# Patient Record
Sex: Female | Born: 1985 | Race: White | Hispanic: No | Marital: Married | Smoking: Never smoker
Health system: Southern US, Community
[De-identification: ages and names within clinical notes are randomized; demographics above are authoritative.]

---
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
## 2005-02-04 ENCOUNTER — Ambulatory Visit: Payer: Self-pay | Admitting: Family Medicine

## 2005-06-18 ENCOUNTER — Ambulatory Visit: Payer: Self-pay | Admitting: Internal Medicine

## 2005-06-18 ENCOUNTER — Inpatient Hospital Stay (HOSPITAL_COMMUNITY): Admission: AD | Admit: 2005-06-18 | Discharge: 2005-06-18 | Payer: Self-pay | Admitting: Obstetrics and Gynecology

## 2005-08-04 ENCOUNTER — Other Ambulatory Visit: Admission: RE | Admit: 2005-08-04 | Discharge: 2005-08-04 | Payer: Self-pay | Admitting: Obstetrics and Gynecology

## 2005-09-30 ENCOUNTER — Ambulatory Visit: Payer: Self-pay | Admitting: Family Medicine

## 2008-07-23 ENCOUNTER — Inpatient Hospital Stay (HOSPITAL_COMMUNITY): Admission: AD | Admit: 2008-07-23 | Discharge: 2008-07-24 | Payer: Self-pay | Admitting: Obstetrics and Gynecology

## 2008-07-25 ENCOUNTER — Inpatient Hospital Stay (HOSPITAL_COMMUNITY): Admission: AD | Admit: 2008-07-25 | Discharge: 2008-07-25 | Payer: Self-pay | Admitting: Obstetrics and Gynecology

## 2008-07-26 ENCOUNTER — Inpatient Hospital Stay (HOSPITAL_COMMUNITY): Admission: AD | Admit: 2008-07-26 | Discharge: 2008-07-27 | Payer: Self-pay | Admitting: Obstetrics and Gynecology

## 2009-09-13 ENCOUNTER — Emergency Department (HOSPITAL_BASED_OUTPATIENT_CLINIC_OR_DEPARTMENT_OTHER): Admission: EM | Admit: 2009-09-13 | Discharge: 2009-09-14 | Payer: Self-pay | Admitting: Emergency Medicine

## 2009-09-14 ENCOUNTER — Ambulatory Visit: Payer: Self-pay | Admitting: Diagnostic Radiology

## 2011-01-12 LAB — CBC
Hemoglobin: 13.9 g/dL (ref 12.0–15.0)
MCHC: 34.9 g/dL (ref 30.0–36.0)
Platelets: 153 10*3/uL (ref 150–400)
RDW: 11.5 % (ref 11.5–15.5)

## 2011-01-12 LAB — DIFFERENTIAL
Basophils Relative: 1 % (ref 0–1)
Eosinophils Absolute: 0 10*3/uL (ref 0.0–0.7)
Eosinophils Relative: 1 % (ref 0–5)
Lymphs Abs: 0.5 10*3/uL — ABNORMAL LOW (ref 0.7–4.0)
Monocytes Absolute: 0.1 10*3/uL (ref 0.1–1.0)
Monocytes Relative: 2 % — ABNORMAL LOW (ref 3–12)
Neutrophils Relative %: 89 % — ABNORMAL HIGH (ref 43–77)

## 2011-01-12 LAB — COMPREHENSIVE METABOLIC PANEL
ALT: 23 U/L (ref 0–35)
AST: 20 U/L (ref 0–37)
Albumin: 4.2 g/dL (ref 3.5–5.2)
Alkaline Phosphatase: 27 U/L — ABNORMAL LOW (ref 39–117)
Chloride: 103 mEq/L (ref 96–112)
Potassium: 3.2 mEq/L — ABNORMAL LOW (ref 3.5–5.1)
Sodium: 143 mEq/L (ref 135–145)
Total Bilirubin: 0.9 mg/dL (ref 0.3–1.2)
Total Protein: 7.1 g/dL (ref 6.0–8.3)

## 2011-02-26 NOTE — Op Note (Signed)
NAME:  Michelle Alexander, Michelle Alexander                          ACCOUNT NO.:  [id number]   MEDICAL RECORD NO.:  [id number]                   PATIENT TYPE:  INP   LOCATION:  9148                                 FACILITY:  [facility]   PHYSICIAN:  Duke Salvia. Marcelle Overlie, M.D.            DATE OF BIRTH:  March 22, 1986   DATE OF PROCEDURE:  02/25/2004  DATE OF DISCHARGE:                                 OPERATIVE REPORT   The patient had epidural anesthesia and had been pushing for about an hour  when it was noted that she was having initially mild tachycardia without  decels.  A scalp lead was applied.  As she continued to push, she developed  more significant fetal tachycardia in the 190 to 200 range with good  variability by IFE without significant decelerations.  At that point, she  was noted to be straight OA, starting to crown.  Because of the persistent  tachycardia, the decision was made to proceed with BE assisted delivery  which was explained to the patient.  Her Foley catheter was removed.  She  had adequate epidural anesthesia.  The perineum was infiltrated with  Xylocaine local.  Kiwi VE was applied, traction efforts coordinated with  maternal pushing x 1 to affect delivery of a female, Apgars 8 and 9.  McRoberts positioning and suprapubic pressure by the RN were applied along  with moderate downward traction to affect delivery of a mild to moderate  shoulder dystocia without difficulty.  The posterior shoulder delivered  easily.  The infant was stimulated and was stable and was later taken to the  newborn nursery in good condition, primarily to evaluate the persistent  tachycardia.  The patient had received two doses of antibiotics for GBS  prophylaxis during the course of her labor.  The placenta was then delivered  spontaneously and sent to pathology.  Exploration revealed midline  episiotomy without extension.  No other lacerations were noted.  Cord blood  was sent for blood studies and also for stem cell  banking.  The episiotomy  was repaired in standard layered fashion with 2-0 Vicryl sutures.  The  rectum and sphincter muscles were both inspected and intact.  Mother and  baby doing well at that point.                                               Richard M. Marcelle Overlie, M.D.    RMH/MEDQ  D:  02/25/2004  T:  02/25/2004  Job:  [id number]

## 2011-03-19 ENCOUNTER — Inpatient Hospital Stay (HOSPITAL_COMMUNITY)
Admission: RE | Admit: 2011-03-19 | Discharge: 2011-03-20 | DRG: 373 | Disposition: A | Discharge status: Home / Self Care | Payer: BC Managed Care – PPO | Source: Ambulatory Visit | Attending: Obstetrics and Gynecology | Admitting: Obstetrics and Gynecology

## 2011-03-19 LAB — CBC
HCT: 37.4 % (ref 36.0–46.0)
MCH: 34.9 pg — ABNORMAL HIGH (ref 26.0–34.0)
MCV: 101.1 fL — ABNORMAL HIGH (ref 78.0–100.0)
RDW: 13.9 % (ref 11.5–15.5)
WBC: 7 10*3/uL (ref 4.0–10.5)

## 2011-03-20 ENCOUNTER — Inpatient Hospital Stay (HOSPITAL_COMMUNITY): Admit: 2011-03-20 | Payer: BC Managed Care – PPO | Admitting: Obstetrics and Gynecology

## 2011-03-20 LAB — CBC
MCH: 33.9 pg (ref 26.0–34.0)
MCHC: 33.5 g/dL (ref 30.0–36.0)
MCV: 101.3 fL — ABNORMAL HIGH (ref 78.0–100.0)
Platelets: 161 10*3/uL (ref 150–400)
RBC: 3.89 MIL/uL (ref 3.87–5.11)

## 2011-04-09 NOTE — Op Note (Signed)
  Michelle Alexander, NYCE NO.:  [id number]  MEDICAL RECORD NO.:  [id number]  LOCATION:  9125                          FACILITY:  [facility]  PHYSICIAN:  Guy Sandifer. Henderson Cloud, M.D. DATE OF BIRTH:  13-Oct-1985  DATE OF PROCEDURE: DATE OF DISCHARGE:                              OPERATIVE REPORT   PROCEDURE:  Vaginal delivery.  PROCEDURE:  This patient is a 25 year old married white female G3, P2 at 39-6/7 weeks whose prenatal care was uncomplicated.  She was admitted for induction with a favorable cervix.  Group B strep culture is negative.  After artificial rupture of membranes for clear fluid, she had rapid progress.  Epidural was placed.  After placing epidural, she was complete and pushing.  She makes good progress in the second stage. Spontaneous delivery of the vertex is noted with the baby in the ROT, ROP position.  At that point, the shoulders are essentially transverse with the back down.  The baby's right elbow was flexed and the hand is in front of the shoulder.  This allows easy rotation of the shoulders in a counterclockwise direction which enables delivery of the right hand and arm without excessive traction.  The baby then delivers without difficulty.  Viable female infant, Apgars of 7 and 9 at 1 and 5 minutes respectively are noted.  Arterial cord pH 7.25.  Birth weight is pending.  Placenta's three vessels intact.  Estimated blood loss 400 mL. Cervix, vagina, perineum are intact.  The patient and infant are stable in labor and delivery room.     Guy Sandifer Henderson Cloud, M.D.     JET/MEDQ  D:  03/19/2011  T:  03/20/2011  Job:  [id number]  Electronically Signed by Harold Hedge M.D. on 04/09/2011 09:14:38 AM

## 2011-07-12 LAB — CBC
HCT: 39.5
MCHC: 33.8
MCV: 99.4
Platelets: 167
RDW: 12.9
RDW: 13

## 2011-07-12 LAB — RPR: RPR Ser Ql: NONREACTIVE

## 2011-07-12 LAB — CCBB MATERNAL DONOR DRAW

## 2013-02-27 ENCOUNTER — Other Ambulatory Visit: Payer: Self-pay | Admitting: Obstetrics and Gynecology

## 2016-10-18 DIAGNOSIS — M810 Age-related osteoporosis without current pathological fracture: Secondary | ICD-10-CM | POA: Diagnosis not present

## 2016-10-18 DIAGNOSIS — R5383 Other fatigue: Secondary | ICD-10-CM | POA: Diagnosis not present

## 2016-10-18 DIAGNOSIS — E559 Vitamin D deficiency, unspecified: Secondary | ICD-10-CM | POA: Diagnosis not present

## 2016-10-22 DIAGNOSIS — Z23 Encounter for immunization: Secondary | ICD-10-CM | POA: Diagnosis not present

## 2017-07-06 DIAGNOSIS — Z23 Encounter for immunization: Secondary | ICD-10-CM | POA: Diagnosis not present

## 2017-11-10 DIAGNOSIS — L811 Chloasma: Secondary | ICD-10-CM | POA: Diagnosis not present

## 2017-11-10 DIAGNOSIS — B078 Other viral warts: Secondary | ICD-10-CM | POA: Diagnosis not present

## 2017-11-10 DIAGNOSIS — L700 Acne vulgaris: Secondary | ICD-10-CM | POA: Diagnosis not present

## 2017-11-23 ENCOUNTER — Encounter (INDEPENDENT_AMBULATORY_CARE_PROVIDER_SITE_OTHER): Payer: Self-pay | Admitting: Physician Assistant

## 2017-11-23 ENCOUNTER — Ambulatory Visit (INDEPENDENT_AMBULATORY_CARE_PROVIDER_SITE_OTHER): Payer: 59

## 2017-11-23 ENCOUNTER — Ambulatory Visit (INDEPENDENT_AMBULATORY_CARE_PROVIDER_SITE_OTHER): Payer: 59 | Admitting: Physician Assistant

## 2017-11-23 VITALS — Ht 66.0 in | Wt 135.0 lb

## 2017-11-23 DIAGNOSIS — M2242 Chondromalacia patellae, left knee: Secondary | ICD-10-CM

## 2017-11-23 DIAGNOSIS — M25562 Pain in left knee: Secondary | ICD-10-CM

## 2017-11-23 MED ORDER — LIDOCAINE HCL 1 % IJ SOLN
3.0000 mL | INTRAMUSCULAR | Status: AC | PRN
  Administered 2017-11-23: 3 mL

## 2017-11-23 MED ORDER — METHYLPREDNISOLONE ACETATE 40 MG/ML IJ SUSP
40.0000 mg | INTRAMUSCULAR | Status: AC | PRN
  Administered 2017-11-23: 40 mg via INTRA_ARTICULAR

## 2017-11-23 NOTE — Progress Notes (Signed)
Office Visit Note   Patient: Michelle Alexander           Date of Birth: 03-29-1986           MRN: [id number] Visit Date: 11/23/2017              Requested by: No referring provider defined for this encounter. PCP: Michelle Alexander   Assessment & Plan: Visit Diagnoses:  1. Acute pain of left knee   2. Chondromalacia patellae, left knee     Plan: Discussed with her knee friendly exercises.  Also quad strengthening exercises reviewed with patient.  She will follow-up with Michelle Alexander on as-needed basis pain persist.  Discussed with her over the counter NSAIDs.  Open patella brace which she can use while doing activities until pain resolves.  Follow-Up Instructions: Return if symptoms worsen or fail to improve.   Orders:  Orders Placed This Encounter  Procedures  . Large Joint Inj: L knee  . XR KNEE 3 VIEW LEFT   No orders of the defined types were placed in this encounter.     Procedures: Large Joint Inj: L knee on 11/23/2017 3:20 PM Indications: pain Details: 22 G 1.5 in needle, anterolateral approach  Arthrogram: No  Medications: 3 mL lidocaine 1 %; 40 mg methylPREDNISolone acetate 40 MG/ML Outcome: tolerated well, no immediate complications Procedure, treatment alternatives, risks and benefits explained, specific risks discussed. Consent was given by the patient. Immediately prior to procedure a time out was called to verify the correct patient, procedure, equipment, support staff and site/side marked as required. Patient was prepped and draped in the usual sterile fashion.       Clinical Data: No additional findings.   Subjective: Chief Complaint  Patient presents with  . Left Knee - Pain    HPI Michelle Alexander is a 32 year old female comes in today due to left knee pain.  She states that she is having problem is been the knee for years but is gotten worse recently.  She states the left knee is painful with some stiffness loosens up with walking.  She has pain with prolonged  sitting.  Sensitive to touch lateral aspect of the kneecap.  Also seems like the knee will not straighten out all the way.  She taken ibuprofen which helps some.  She does play tennis and does work out a lot.  She reports 10 years ago she received a shot by Michelle Alexander went to physical therapy which helps some with her knee pain.  She does feel like the knee gives way at times.  Review of Systems  Please see HPI otherwise negative  Objective: Vital Signs: Ht 5\' 6"  (1.676 m)   Wt 135 lb (61.2 kg)   BMI 21.79 kg/m   Physical Exam  Constitutional: She is oriented to person, place, and time. She appears well-developed and well-nourished. No distress.  Pulmonary/Chest: Effort normal.  Neurological: She is alert and oriented to person, place, and time.  Skin: She is not diaphoretic.  Psychiatric: She has a normal mood and affect.    Ortho Exam Bilateral knees no effusion abnormal warmth erythema.  She may lack a couple degrees of hyperextension of the left knee compared to the right but otherwise good range of motion of both knees.  No instability valgus varus stressing McMurray's negative bilaterally.  She has no tenderness along medial lateral joint line of either knee.  Left knee with tenderness peripatellar region.  Manipulation of the patella reports that the  patellofemoral notch causes discomfort.  She has a positive Michelle Alexander on the left negative on the right.  No tenderness over the patella tibial tendon she will do straight leg raise bilaterally. Specialty Comments:  No specialty comments available.  Imaging: Xr Knee 3 View Left  Result Date: 11/23/2017 Left knee AP lateral and sunrise views: No acute findings.  All 3 compartments well maintained.  No subluxation dislocation of the knee.    PMFS History: There are no active problems to display for this patient.  History reviewed. No pertinent past medical history.  History reviewed. No pertinent family history.  History  reviewed. No pertinent surgical history. Social History   Occupational History  . Not on file  Tobacco Use  . Smoking status: Never Smoker  . Smokeless tobacco: Never Used  Substance and Sexual Activity  . Alcohol use: Yes  . Drug use: No  . Sexual activity: Not on file

## 2018-07-13 DIAGNOSIS — L700 Acne vulgaris: Secondary | ICD-10-CM | POA: Diagnosis not present

## 2018-07-13 DIAGNOSIS — B078 Other viral warts: Secondary | ICD-10-CM | POA: Diagnosis not present

## 2018-07-17 DIAGNOSIS — Z23 Encounter for immunization: Secondary | ICD-10-CM | POA: Diagnosis not present

## 2018-07-17 DIAGNOSIS — Z6823 Body mass index (BMI) 23.0-23.9, adult: Secondary | ICD-10-CM | POA: Diagnosis not present

## 2018-07-17 DIAGNOSIS — H938X9 Other specified disorders of ear, unspecified ear: Secondary | ICD-10-CM | POA: Diagnosis not present

## 2018-12-01 DIAGNOSIS — R5383 Other fatigue: Secondary | ICD-10-CM | POA: Diagnosis not present

## 2018-12-01 DIAGNOSIS — R5381 Other malaise: Secondary | ICD-10-CM | POA: Diagnosis not present

## 2018-12-01 DIAGNOSIS — R05 Cough: Secondary | ICD-10-CM | POA: Diagnosis not present

## 2019-12-27 ENCOUNTER — Ambulatory Visit: Payer: 59 | Attending: Internal Medicine

## 2019-12-27 DIAGNOSIS — Z23 Encounter for immunization: Secondary | ICD-10-CM

## 2019-12-27 NOTE — Progress Notes (Signed)
   Covid-19 Vaccination Clinic  Name:  LANICE FOLDEN    MRN: [id number] DOB: [birth year]  12/27/2019  Ms. Bale was observed post Covid-19 immunization for 15 minutes without incident. She was provided with Vaccine Information Sheet and instruction to access the V-Safe system.   Ms. Gilmer was instructed to call 911 with any severe reactions post vaccine: Marland Kitchen Difficulty breathing  . Swelling of face and throat  . A fast heartbeat  . A bad rash all over body  . Dizziness and weakness   Immunizations Administered    Name Date Dose VIS Date Route   Pfizer COVID-19 Vaccine 12/27/2019 10:04 AM 0.3 mL 09/21/2019 Intramuscular   Manufacturer: [employer], [employer]   Lot: [id number]   NDC: [address]-2

## 2020-01-09 ENCOUNTER — Other Ambulatory Visit: Payer: 59

## 2020-01-09 ENCOUNTER — Ambulatory Visit: Payer: 59 | Attending: Internal Medicine

## 2020-01-09 DIAGNOSIS — Z20822 Contact with and (suspected) exposure to covid-19: Secondary | ICD-10-CM

## 2020-01-10 LAB — SARS-COV-2, NAA 2 DAY TAT

## 2020-01-10 LAB — NOVEL CORONAVIRUS, NAA: SARS-CoV-2, NAA: NOT DETECTED

## 2020-01-21 ENCOUNTER — Ambulatory Visit: Payer: 59 | Attending: Internal Medicine

## 2020-01-21 DIAGNOSIS — Z23 Encounter for immunization: Secondary | ICD-10-CM

## 2020-01-21 NOTE — Progress Notes (Signed)
   Covid-19 Vaccination Clinic  Name:  Michelle Alexander    MRN: [id number] DOB: March 28, 1986  01/21/2020  [name] was observed post Covid-19 immunization for 15 minutes without incident. She was provided with Vaccine Information Sheet and instruction to access the V-Safe system.   [name] was instructed to call 911 with any severe reactions post vaccine: Marland Kitchen Difficulty breathing  . Swelling of face and throat  . A fast heartbeat  . A bad rash all over body  . Dizziness and weakness   Immunizations Administered    Name Date Dose VIS Date Route   Pfizer COVID-19 Vaccine 01/21/2020 12:07 PM 0.3 mL 09/21/2019 Intramuscular   Manufacturer: [employer], [employer]   Lot: [id number]   NDC: [address]-8

## 2020-03-03 ENCOUNTER — Ambulatory Visit: Payer: 59 | Attending: Internal Medicine

## 2020-03-03 DIAGNOSIS — Z20822 Contact with and (suspected) exposure to covid-19: Secondary | ICD-10-CM

## 2020-03-04 LAB — SARS-COV-2, NAA 2 DAY TAT

## 2020-03-04 LAB — NOVEL CORONAVIRUS, NAA: SARS-CoV-2, NAA: NOT DETECTED

## 2020-03-24 ENCOUNTER — Ambulatory Visit: Payer: 59

## 2020-03-31 ENCOUNTER — Ambulatory Visit: Payer: 59 | Attending: Internal Medicine

## 2020-03-31 ENCOUNTER — Other Ambulatory Visit: Payer: 59

## 2020-03-31 DIAGNOSIS — Z20822 Contact with and (suspected) exposure to covid-19: Secondary | ICD-10-CM

## 2020-04-01 LAB — NOVEL CORONAVIRUS, NAA: SARS-CoV-2, NAA: NOT DETECTED

## 2020-04-01 LAB — SARS-COV-2, NAA 2 DAY TAT

## 2020-05-14 ENCOUNTER — Encounter: Payer: Self-pay | Admitting: Orthopaedic Surgery

## 2020-05-14 ENCOUNTER — Ambulatory Visit: Payer: 59 | Admitting: Orthopaedic Surgery

## 2020-05-14 ENCOUNTER — Ambulatory Visit: Payer: Self-pay

## 2020-05-14 VITALS — Ht 67.0 in | Wt 137.6 lb

## 2020-05-14 DIAGNOSIS — M25521 Pain in right elbow: Secondary | ICD-10-CM

## 2020-05-14 MED ORDER — PENNSAID 2 % EX SOLN: 2.0000 g | Freq: Two times a day (BID) | CUTANEOUS | 3 refills | Status: DC | PRN

## 2020-05-14 NOTE — Progress Notes (Signed)
   Office Visit Note   Patient: Michelle Alexander           Date of Birth: [birth year]           MRN: [id number] Visit Date: 05/14/2020              Requested by: Martha Clan, MD [address] [place],  [place] [address] PCP: Martha Clan, MD   Assessment & Plan: Visit Diagnoses:  1. Pain in right elbow     Plan: Impression is right elbow lateral epicondylitis.  We had a detailed discussion today on treatment options and on things that she can modify in terms of her tennis gear.  She has ordered a counterforce brace.  I sent a prescription for pennsaid.  Referral to outpatient PT with modalities should she want to do this.  I gave her home exercises with resistance bands as well.  We discussed proper warm up and relative activity modifications.  Continue take Advil as needed.  Do not feel that she is in need of a cortisone injection today.  Questions encouraged and answered.  Follow-Up Instructions: Return if symptoms worsen or fail to improve.   Orders:  Orders Placed This Encounter  Procedures  . XR Elbow Complete Right (3+View)  . Ambulatory referral to Physical Therapy   Meds ordered this encounter  Medications  . Diclofenac Sodium (PENNSAID) 2 % SOLN    Sig: Apply 2 g topically 2 (two) times daily as needed (to affected area).    Dispense:  112 g    Refill:  3      Procedures: No procedures performed   Clinical Data: No additional findings.   Subjective: Chief Complaint  Patient presents with  . Right Elbow - Pain    [name] is a 34 year old female who is one of my wife's friends who comes in for lateral elbow pain for about 6 weeks with progressive worsening.  She did recently make a change in her tendon strings to polyester.  She states that she has pain when she is playing tennis as well as when she is opening the refrigerator door and picking up stuff.  Advil does help.  Denies any numbness and tingling.  Denies any injuries.   Review of  Systems   Objective: Vital Signs: Ht 5\' 7"  (1.702 m)   Wt 137 lb 9.6 oz (62.4 kg)   BMI 21.55 kg/m   Physical Exam  Ortho Exam Right elbow shows full range of motion without swelling or temperature changes.  She has tenderness of the lateral epicondyle near the common extensor attachment.  Radial tunnel is nontender.  She has reproducible pain with resisted wrist extension and ECRB extension.  Medial side of the elbow is unremarkable. Specialty Comments:  No specialty comments available.  Imaging: XR Elbow Complete Right (3+View)  Result Date: 05/14/2020 No acute or structural abnormalities.    PMFS History: There are no problems to display for this patient.  History reviewed. No pertinent past medical history.  History reviewed. No pertinent family history.  History reviewed. No pertinent surgical history. Social History   Occupational History  . Not on file  Tobacco Use  . Smoking status: Never Smoker  . Smokeless tobacco: Never Used  Substance and Sexual Activity  . Alcohol use: Yes  . Drug use: No  . Sexual activity: Not on file

## 2020-06-10 ENCOUNTER — Encounter: Payer: Self-pay | Admitting: Orthopaedic Surgery

## 2020-06-10 ENCOUNTER — Ambulatory Visit (INDEPENDENT_AMBULATORY_CARE_PROVIDER_SITE_OTHER): Payer: 59 | Admitting: Orthopaedic Surgery

## 2020-06-10 VITALS — Ht 67.0 in | Wt 137.0 lb

## 2020-06-10 DIAGNOSIS — M7711 Lateral epicondylitis, right elbow: Secondary | ICD-10-CM | POA: Diagnosis not present

## 2020-06-10 MED ORDER — LIDOCAINE HCL 1 % IJ SOLN
1.0000 mL | INTRAMUSCULAR | Status: AC | PRN
  Administered 2020-06-10: 1 mL

## 2020-06-10 MED ORDER — BUPIVACAINE HCL 0.5 % IJ SOLN
1.0000 mL | INTRAMUSCULAR | Status: AC | PRN
  Administered 2020-06-10: 1 mL via INTRA_ARTICULAR

## 2020-06-10 MED ORDER — METHYLPREDNISOLONE ACETATE 40 MG/ML IJ SUSP
40.0000 mg | INTRAMUSCULAR | Status: AC | PRN
  Administered 2020-06-10: 40 mg via INTRA_ARTICULAR

## 2020-06-10 NOTE — Progress Notes (Signed)
   Office Visit Note   Patient: Michelle Alexander           Date of Birth: [birth year]           MRN: [id number] Visit Date: 06/10/2020              Requested by: Martha Clan, MD [address] [place],  [place] [address] PCP: Martha Clan, MD   Assessment & Plan: Visit Diagnoses:  1. Lateral epicondylitis, right elbow     Plan: Impression is worsening right lateral epicondylitis with ADLs.  At this point I feel is appropriate to perform a cortisone injection.  She will continue to rest from tennis.  She has made a lot of modifications to her equipment.  She tolerated the injection well today.  Follow-Up Instructions: Return if symptoms worsen or fail to improve.   Orders:  No orders of the defined types were placed in this encounter.  No orders of the defined types were placed in this encounter.     Procedures: Medium Joint Inj: R lateral epicondyle on 06/10/2020 11:29 AM Indications: pain Details: 25 G needle Medications: 1 mL lidocaine 1 %; 1 mL bupivacaine 0.5 %; 40 mg methylPREDNISolone acetate 40 MG/ML Outcome: tolerated well, no immediate complications Patient was prepped and draped in the usual sterile fashion.       Clinical Data: No additional findings.   Subjective: Chief Complaint  Patient presents with  . Right Elbow - Follow-up    Michelle Alexander returns today for continued right elbow pain from lateral epicondylitis.  She now has pain in her elbow even with almost all ADLs.   Review of Systems   Objective: Vital Signs: Ht 5\' 7"  (1.702 m)   Wt 137 lb (62.1 kg)   BMI 21.46 kg/m   Physical Exam  Ortho Exam Exam is unchanged.  Continued point tenderness to the common extensor tendon insertion with pain with resisted long finger extension.  No numbness or tingling radiating down into the forearm or fingers. Specialty Comments:  No specialty comments available.  Imaging: No results found.   PMFS History: There are no problems to display for this  patient.  History reviewed. No pertinent past medical history.  History reviewed. No pertinent family history.  History reviewed. No pertinent surgical history. Social History   Occupational History  . Not on file  Tobacco Use  . Smoking status: Never Smoker  . Smokeless tobacco: Never Used  Substance and Sexual Activity  . Alcohol use: Yes  . Drug use: No  . Sexual activity: Not on file

## 2020-06-12 ENCOUNTER — Ambulatory Visit: Payer: 59 | Admitting: Physician Assistant

## 2020-06-24 ENCOUNTER — Ambulatory Visit: Payer: 59 | Admitting: Orthopaedic Surgery

## 2020-07-14 ENCOUNTER — Other Ambulatory Visit: Payer: 59

## 2020-07-14 DIAGNOSIS — Z20822 Contact with and (suspected) exposure to covid-19: Secondary | ICD-10-CM

## 2020-07-16 LAB — NOVEL CORONAVIRUS, NAA: SARS-CoV-2, NAA: NOT DETECTED

## 2020-07-16 LAB — SARS-COV-2, NAA 2 DAY TAT

## 2020-10-11 ENCOUNTER — Telehealth: Payer: Self-pay | Admitting: Orthopaedic Surgery

## 2020-10-11 NOTE — Telephone Encounter (Signed)
I received a message from [place] stating that her right elbow continues to hurt despite having had the steroid injection in august.  She had temporary relief from the injection.  Has continued pain with ADLs and with playing tennis.  At this point, we will need to order an MRI of the right elbow to assess for structural abnormalities.

## 2020-10-13 ENCOUNTER — Telehealth: Payer: Self-pay

## 2020-10-13 DIAGNOSIS — M25521 Pain in right elbow: Secondary | ICD-10-CM

## 2020-10-13 NOTE — Telephone Encounter (Signed)
Per Dr.Xu need to order MRI of right elbow    MRI ordered

## 2020-10-17 ENCOUNTER — Ambulatory Visit
Admission: RE | Admit: 2020-10-17 | Discharge: 2020-10-17 | Disposition: A | Discharge status: Home / Self Care | Payer: 59 | Source: Ambulatory Visit | Attending: Orthopaedic Surgery | Admitting: Orthopaedic Surgery

## 2020-10-17 ENCOUNTER — Other Ambulatory Visit: Payer: Self-pay

## 2020-10-17 DIAGNOSIS — M25521 Pain in right elbow: Secondary | ICD-10-CM

## 2020-10-20 NOTE — Progress Notes (Signed)
I spoke to Michelle Alexander yesterday about MRI results.  Please call her to schedule surgery.  SCG, supraclavicular or axillary block, biomet anchors available.  CPT tennis elbow release with repair.  Thanks.

## 2020-10-29 ENCOUNTER — Other Ambulatory Visit: Payer: Self-pay | Admitting: Physician Assistant

## 2020-10-29 MED ORDER — ONDANSETRON HCL 4 MG PO TABS: 4.0000 mg | ORAL_TABLET | Freq: Three times a day (TID) | ORAL | 0 refills | Status: DC | PRN

## 2020-10-29 MED ORDER — HYDROCODONE-ACETAMINOPHEN 5-325 MG PO TABS
1.0000 | ORAL_TABLET | Freq: Three times a day (TID) | ORAL | 0 refills | Status: DC | PRN
Start: 2020-10-29 — End: 2023-06-29

## 2020-10-30 ENCOUNTER — Other Ambulatory Visit: Payer: Self-pay | Admitting: Orthopaedic Surgery

## 2020-10-30 ENCOUNTER — Encounter: Payer: Self-pay | Admitting: Orthopaedic Surgery

## 2020-10-30 DIAGNOSIS — M7711 Lateral epicondylitis, right elbow: Secondary | ICD-10-CM | POA: Insufficient documentation

## 2020-10-30 MED ORDER — OXYCODONE HCL 5 MG PO TABS
5.0000 mg | ORAL_TABLET | ORAL | 0 refills | Status: DC | PRN
Start: 2020-10-30 — End: 2023-06-29

## 2020-11-06 ENCOUNTER — Other Ambulatory Visit: Payer: Self-pay

## 2020-11-06 ENCOUNTER — Ambulatory Visit (INDEPENDENT_AMBULATORY_CARE_PROVIDER_SITE_OTHER): Payer: 59 | Admitting: Orthopaedic Surgery

## 2020-11-06 ENCOUNTER — Encounter: Payer: Self-pay | Admitting: Orthopaedic Surgery

## 2020-11-06 DIAGNOSIS — M7711 Lateral epicondylitis, right elbow: Secondary | ICD-10-CM

## 2020-11-06 NOTE — Progress Notes (Signed)
   Post-Op Visit Note   Patient: Michelle Alexander           Date of Birth: 1986/09/07           MRN: [id number] Visit Date: 11/06/2020 PCP: Martha Clan, MD   Assessment & Plan:  Chief Complaint:  Chief Complaint  Patient presents with  . Right Elbow - Pain, Routine Post Op   Visit Diagnoses:  1. Lateral epicondylitis, right elbow     Plan:   [name] is 1 week status post right tennis elbow release.  Overall doing well with some mild pain.  No real complaints.  Numbness in the index finger has completely resolved.  Surgical incision is intact.  No signs of infection.  She has some mild stiffness of the elbow.  At this point we will apply Neosporin with a large Band-Aid on a daily basis.  She will continue to wear the wrist brace for very light activities.  She will work on elbow range of motion on her own.  I demonstrated exercises.  Recheck in 3 weeks.  Follow-Up Instructions: Return in about 3 weeks (around 11/27/2020).   Orders:  No orders of the defined types were placed in this encounter.  No orders of the defined types were placed in this encounter.   Imaging: No results found.  PMFS History: Patient Active Problem List   Diagnosis Date Noted  . Lateral epicondylitis, right elbow 10/30/2020   History reviewed. No pertinent past medical history.  History reviewed. No pertinent family history.  History reviewed. No pertinent surgical history. Social History   Occupational History  . Not on file  Tobacco Use  . Smoking status: Never Smoker  . Smokeless tobacco: Never Used  Substance and Sexual Activity  . Alcohol use: Yes  . Drug use: No  . Sexual activity: Not on file

## 2020-11-27 ENCOUNTER — Encounter: Payer: Self-pay | Admitting: Orthopaedic Surgery

## 2020-11-27 ENCOUNTER — Ambulatory Visit (INDEPENDENT_AMBULATORY_CARE_PROVIDER_SITE_OTHER): Payer: 59 | Admitting: Orthopaedic Surgery

## 2020-11-27 DIAGNOSIS — M7711 Lateral epicondylitis, right elbow: Secondary | ICD-10-CM

## 2020-11-27 NOTE — Progress Notes (Signed)
   Post-Op Visit Note   Patient: Michelle Alexander           Date of Birth: 04-11-1986           MRN: [id number] Visit Date: 11/27/2020 PCP: Martha Clan, MD   Assessment & Plan:  Chief Complaint:  Chief Complaint  Patient presents with  . Right Elbow - Pain   Visit Diagnoses:  1. Lateral epicondylitis, right elbow     Plan:   Edrie is 4 weeks status post right tennis elbow release.  Overall doing well.  She has weaned herself out of the wrist brace.  Surgical scar is fully healed.  She lacks about 5 degrees of full elbow extension and flexion.  There is no pain with passive stretch or with grasping.  No signs of infection.  At this point we will make a referral to OT to begin range of motion and strengthening and ADLs.  She is to limit lifting to 5 to 10 pounds or anything that causes pain.  Recheck in 8 weeks or sooner if any issues.  Follow-Up Instructions: Return in about 8 weeks (around 01/22/2021).   Orders:  Orders Placed This Encounter  Procedures  . Ambulatory referral to Occupational Therapy   No orders of the defined types were placed in this encounter.   Imaging: No results found.  PMFS History: Patient Active Problem List   Diagnosis Date Noted  . Lateral epicondylitis, right elbow 10/30/2020   History reviewed. No pertinent past medical history.  History reviewed. No pertinent family history.  History reviewed. No pertinent surgical history. Social History   Occupational History  . Not on file  Tobacco Use  . Smoking status: Never Smoker  . Smokeless tobacco: Never Used  Substance and Sexual Activity  . Alcohol use: Yes  . Drug use: No  . Sexual activity: Not on file

## 2020-12-05 ENCOUNTER — Other Ambulatory Visit: Payer: Self-pay

## 2020-12-05 ENCOUNTER — Ambulatory Visit: Payer: 59 | Attending: Orthopaedic Surgery | Admitting: Occupational Therapy

## 2020-12-05 DIAGNOSIS — M6281 Muscle weakness (generalized): Secondary | ICD-10-CM | POA: Diagnosis present

## 2020-12-05 DIAGNOSIS — M25621 Stiffness of right elbow, not elsewhere classified: Secondary | ICD-10-CM | POA: Diagnosis present

## 2020-12-05 DIAGNOSIS — M25521 Pain in right elbow: Secondary | ICD-10-CM | POA: Insufficient documentation

## 2020-12-05 DIAGNOSIS — M7711 Lateral epicondylitis, right elbow: Secondary | ICD-10-CM | POA: Diagnosis present

## 2020-12-05 DIAGNOSIS — M25631 Stiffness of right wrist, not elsewhere classified: Secondary | ICD-10-CM | POA: Insufficient documentation

## 2020-12-05 NOTE — Therapy (Signed)
[facility] [address] [place], [place], [address] Phone: [phone]   Fax:  [phone]  Occupational Therapy Evaluation  Patient Details  Name: Michelle Alexander MRN: [id number] Date of Birth: 1986/06/14 Referring Provider (OT): Gershon Mussel (MD)   Encounter Date: 12/05/2020   OT End of Session - 12/05/20 1612    Visit Number 1    Number of Visits 9    Date for OT Re-Evaluation 01/02/21    Authorization Type [employer] $30 copay    Authorization Time Period VL:23 OT    Authorization - Number of Visits 23    OT Start Time 1500    OT Stop Time 1533    OT Time Calculation (min) 33 min    Activity Tolerance Patient tolerated treatment well    Behavior During Therapy [facility] for tasks assessed/performed           No past medical history on file.  No past surgical history on file.  There were no vitals filed for this visit.   Subjective Assessment - 12/05/20 1503    Subjective  Pt reports right elbow just feels tight and weak.    Pertinent History PMH    Currently in Pain? Yes    Pain Score 3     Pain Location Elbow    Pain Orientation Right    Pain Descriptors / Indicators Tightness    Pain Type Acute pain    Pain Onset 1 to 4 weeks ago    Pain Frequency Intermittent             [facility] OT Assessment - 12/05/20 1504      Assessment   Medical Diagnosis Lateral Epicondylitis, right    Referring Provider (OT) Gershon Mussel (MD)    Onset Date/Surgical Date 10/30/20    Hand Dominance Right      Precautions   Precautions None    Precaution Comments progressive strengthening, ROM and ADLs    Required Braces or Orthoses Other Brace/Splint    Other Brace/Splint right wrist brace      Restrictions   Weight Bearing Restrictions No      Balance Screen   Has the patient fallen in the past 6 months No      Home  Environment   Family/patient expects to be discharged to: Private residence    Living Arrangements  Spouse/significant other   and children (3)   Type of Home House    Home Access Stairs    Bathroom Shower/Tub Walk-in Shower    Additional Comments pt not at risk for falls with stairs and home layout      Prior Function   Level of Independence Independent    Vocation Unemployed    Leisure stuff with kids, playing tennis      ADL   ADL comments Pt independent with all ADLs and does not report any difficulty at this time      Written Expression   Dominant Hand Right    Handwriting 100% legible   fatigues with handwriting     Cognition   Overall Cognitive Status Within Functional Limits for tasks assessed      Posture/Postural Control   Posture/Postural Control No significant limitations      Sensation   Light Touch Appears Intact    Hot/Cold Appears Intact      Coordination   9 Hole Peg Test Right;Left    Right 9 Hole Peg Test 16.03    Left 9 [place]  Peg Test 16.28      ROM / Strength   AROM / PROM / Strength AROM;Strength      AROM   Overall AROM  Within functional limits for tasks performed    Overall AROM Comments some tightness at elbow per pt report but ROM [facility]      Strength   Overall Strength Deficits    Overall Strength Comments RUE deficits    Strength Assessment Site Elbow;Wrist    Right/Left Elbow Right    Right Elbow Flexion 4-/5    Right Elbow Extension 4-/5    Right/Left Wrist Right    Right Wrist Flexion 3+/5    Right Wrist Extension 3+/5      Hand Function   Right Hand Gross Grasp Functional    Right Hand Grip (lbs) 54.8    Left Hand Gross Grasp Functional    Left Hand Grip (lbs) 68.3                           OT Education - 12/05/20 1547    Education Details Education on role and purpose of OT  HEP for elbow ROM    Person(s) Educated Patient    Methods Explanation;Demonstration;Handout    Comprehension Verbalized understanding;Returned demonstration            OT Short Term Goals - 12/05/20 1614      OT SHORT TERM  GOAL #1   Title Pt will be independent with HEP 01/02/21    Time 4    Period Weeks    Status New    Target Date 01/02/21      OT SHORT TERM GOAL #2   Title Pt will increase functional grasp in RUE to 60 lbs or greater in dominant hand.    Baseline RUE 54.8, LUE 68.3    Time 4    Period Weeks    Status New      OT SHORT TERM GOAL #3   Title Pt will report pain of 3/10 or less in RUE during supination and elbow extension for increasing independence and ability to play tennis again.    Time 4    Period Weeks    Status New      OT SHORT TERM GOAL #4   Title Pt will demonstrate ability to lift heavier household items (i.e. laundry detergent, gallon of milk, etc) with report of pain of 3/10 or less    Time 4    Period Weeks    Status New      OT SHORT TERM GOAL #5   Title Pt will verbalize pain management strategies    Time 4    Period Weeks    Status New             OT Long Term Goals - 12/05/20 1618      OT LONG TERM GOAL #1   Title STGs are LTGs                 Plan - 12/05/20 1518    Clinical Impression Statement Pt is a 35 year old female that presents to neuro OPOT s/p lateral epiocondylitis/tennis elbow tendon release, debridement surgery on 10/30/2020. Pt with no significant PMH. Pt presents with limitations with overall strength and ROM and pain impeding overall daily activities. Skilled occupational therapy is recommended to target these deficits and increase quality of life.    OT Occupational Profile and History Problem Focused Assessment -  Including review of records relating to presenting problem    Occupational performance deficits (Please refer to evaluation for details): ADL's;IADL's    Body Structure / Function / Physical Skills Coordination;IADL;Strength;Pain;Dexterity;FMC;UE functional use;ROM    Rehab Potential Good    Clinical Decision Making Limited treatment options, no task modification necessary    Comorbidities Affecting Occupational  Performance: None    Modification or Assistance to Complete Evaluation  No modification of tasks or assist necessary to complete eval    OT Frequency 2x / week    OT Duration 4 weeks    OT Treatment/Interventions Self-care/ADL training;Cryotherapy;Ultrasound;Traction;Moist Heat;Electrical Stimulation;DME and/or AE instruction;Therapeutic exercise;Patient/family education;Splinting;Therapeutic activities;Passive range of motion    Plan review HEP for elbow ROM, maybe add strengthening?, maybe ultrasound?    OT Home Exercise Plan AROM elbow HEP    Consulted and Agree with Plan of Care Patient           Patient will benefit from skilled therapeutic intervention in order to improve the following deficits and impairments:   Body Structure / Function / Physical Skills: Coordination,IADL,Strength,Pain,Dexterity,FMC,UE functional use,ROM       Visit Diagnosis: Lateral epicondylitis of right elbow - Plan: Ot plan of care cert/re-cert  Pain in right elbow - Plan: Ot plan of care cert/re-cert  Stiffness of right elbow, not elsewhere classified - Plan: Ot plan of care cert/re-cert  Stiffness of right wrist, not elsewhere classified - Plan: Ot plan of care cert/re-cert  Muscle weakness (generalized) - Plan: Ot plan of care cert/re-cert    Problem List Patient Active Problem List   Diagnosis Date Noted  . Lateral epicondylitis, right elbow 10/30/2020    Junious Dresser MOT, OTR/L  12/05/2020, 4:19 PM  [facility] [address] [place], [place], [address] Phone: [phone]   Fax:  [phone]  Name: JAHNASIA TATUM MRN: [id number] Date of Birth: 23-Dec-1985

## 2020-12-16 ENCOUNTER — Ambulatory Visit: Payer: 59 | Attending: Orthopaedic Surgery | Admitting: Occupational Therapy

## 2020-12-16 ENCOUNTER — Encounter: Payer: Self-pay | Admitting: Occupational Therapy

## 2020-12-16 ENCOUNTER — Other Ambulatory Visit: Payer: Self-pay

## 2020-12-16 DIAGNOSIS — M25521 Pain in right elbow: Secondary | ICD-10-CM | POA: Insufficient documentation

## 2020-12-16 DIAGNOSIS — M7711 Lateral epicondylitis, right elbow: Secondary | ICD-10-CM | POA: Diagnosis present

## 2020-12-16 DIAGNOSIS — M25631 Stiffness of right wrist, not elsewhere classified: Secondary | ICD-10-CM | POA: Diagnosis present

## 2020-12-16 DIAGNOSIS — M25621 Stiffness of right elbow, not elsewhere classified: Secondary | ICD-10-CM | POA: Insufficient documentation

## 2020-12-16 DIAGNOSIS — M6281 Muscle weakness (generalized): Secondary | ICD-10-CM | POA: Diagnosis present

## 2020-12-16 NOTE — Patient Instructions (Signed)
1. Grip Strengthening (Resistive Putty)   Squeeze putty using thumb and all fingers. Repeat _20___ times. Do __2__ sessions per day.   2. Roll putty into tube on table and pinch between each finger and thumb x 10 reps each. (can do ring and small finger together)  Finger / Thumb Extensors    Place right fingers and thumb in center of putty donut, stretch out. Repeat ____ times. Do ____ sessions per day.  Copyright  VHI. All rights reserved.     Copyright  VHI. All rights reserved.   

## 2020-12-16 NOTE — Therapy (Signed)
[facility] [address] [place], [place], [address] Phone: [phone]   Fax:  [phone]  Occupational Therapy Treatment  Patient Details  Name: Michelle Alexander MRN: [id number] Date of Birth: June 05, 1986 Referring Provider (OT): Frankey Shown (MD)   Encounter Date: 12/16/2020   OT End of Session - 12/16/20 1537    Visit Number 2    Number of Visits 9    Date for OT Re-Evaluation 01/02/21    Authorization Type [employer] $30 copay    Authorization Time Period VL:23 OT    Authorization - Number of Visits 23    OT Start Time 9373    OT Stop Time 1603   discharge   OT Time Calculation (min) 26 min    Activity Tolerance Patient tolerated treatment well    Behavior During Therapy [facility] for tasks assessed/performed           History reviewed. No pertinent past medical history.  History reviewed. No pertinent surgical history.  There were no vitals filed for this visit.   Subjective Assessment - 12/16/20 1537    Subjective  "just some stiffness in my elbow"    Pertinent History PMH    Currently in Pain? Yes    Pain Score 3     Pain Location Elbow    Pain Orientation Right    Pain Descriptors / Indicators Tightness    Pain Type Acute pain    Pain Onset 1 to 4 weeks ago    Pain Frequency Intermittent               OCCUPATIONAL THERAPY DISCHARGE SUMMARY  Visits from Start of Care: 2   Current functional level related to goals / functional outcomes: Pt has made great progress and is ready for discharge from occupational therapy. Pt has some tightness and stiffness in elbow but is compliant with stretches and exercises. Graduated strengthening program issued to patient this day. Pt verbalized understanding and demonstrated understanding.    Remaining deficits: Some tightness in elbow RUE   Education / Equipment: HEPs  Plan: Patient agrees to discharge.  Patient goals were met. Patient is being discharged due to  meeting the stated rehab goals.  ?????               OT Treatments/Exercises (OP) - 12/16/20 1542      Exercises   Exercises Elbow      Elbow Exercises   Other elbow exercises hammer with supination/pronation with RUE                  OT Education - 12/16/20 1549    Education Details added strengthening with wrist and elbow exercises  issued theraputty exercises (has putty at home)    Person(s) Educated Patient    Methods Explanation;Demonstration;Handout    Comprehension Verbalized understanding;Returned demonstration            OT Short Term Goals - 12/16/20 1539      OT SHORT TERM GOAL #1   Title Pt will be independent with HEP 01/02/21    Time 4    Period Weeks    Status Achieved    Target Date 01/02/21      OT SHORT TERM GOAL #2   Title Pt will increase functional grasp in RUE to 60 lbs or greater in dominant hand.    Baseline RUE 54.8, LUE 68.3    Time 4    Period Weeks    Status Achieved  79.5 lbs RUE     OT SHORT TERM GOAL #3   Title Pt will report pain of 3/10 or less in RUE during supination and elbow extension for increasing independence and ability to play tennis again.    Time 4    Period Weeks    Status Achieved      OT SHORT TERM GOAL #4   Title Pt will demonstrate ability to lift heavier household items (i.e. laundry detergent, gallon of milk, etc) with report of pain of 3/10 or less    Time 4    Period Weeks    Status Achieved      OT SHORT TERM GOAL #5   Title Pt will verbalize pain management strategies    Time 4    Period Weeks    Status Achieved             OT Long Term Goals - 12/05/20 1618      OT LONG TERM GOAL #1   Title STGs are LTGs                 Plan - 12/16/20 1602    Clinical Impression Statement Pt has made great progress and is ready for discharge from occupational therapy. Pt has some tightness and stiffness in elbow but is compliant with stretches and exercises. Graduated strengthening  program issued to patient this day. Pt verbalized understanding and demonstrated understanding.    OT Occupational Profile and History Problem Focused Assessment - Including review of records relating to presenting problem    Occupational performance deficits (Please refer to evaluation for details): ADL's;IADL's    Body Structure / Function / Physical Skills Coordination;IADL;Strength;Pain;Dexterity;FMC;UE functional use;ROM    Rehab Potential Good    Clinical Decision Making Limited treatment options, no task modification necessary    Comorbidities Affecting Occupational Performance: None    Modification or Assistance to Complete Evaluation  No modification of tasks or assist necessary to complete eval    OT Frequency 2x / week    OT Duration 4 weeks    OT Treatment/Interventions Self-care/ADL training;Cryotherapy;Ultrasound;Traction;Moist Heat;Electrical Stimulation;DME and/or AE instruction;Therapeutic exercise;Patient/family education;Splinting;Therapeutic activities;Passive range of motion    Plan discharge from OT    OT Home Exercise Plan AROM elbow HEP    Consulted and Agree with Plan of Care Patient           Patient will benefit from skilled therapeutic intervention in order to improve the following deficits and impairments:   Body Structure / Function / Physical Skills: Coordination,IADL,Strength,Pain,Dexterity,FMC,UE functional use,ROM       Visit Diagnosis: Lateral epicondylitis of right elbow  Pain in right elbow  Stiffness of right elbow, not elsewhere classified  Stiffness of right wrist, not elsewhere classified  Muscle weakness (generalized)    Problem List Patient Active Problem List   Diagnosis Date Noted  . Lateral epicondylitis, right elbow 10/30/2020    Zachery Conch MOT, OTR/L  12/16/2020, 4:05 PM  [facility] [address] [place], [place], [address] Phone: [phone]   Fax:   [phone]  Name: Michelle Alexander MRN: [id number] Date of Birth: 1985/10/27

## 2020-12-18 ENCOUNTER — Ambulatory Visit: Payer: 59 | Admitting: Occupational Therapy

## 2020-12-22 ENCOUNTER — Ambulatory Visit: Payer: 59 | Admitting: Occupational Therapy

## 2020-12-25 ENCOUNTER — Ambulatory Visit: Payer: 59 | Admitting: Occupational Therapy

## 2020-12-30 ENCOUNTER — Ambulatory Visit: Payer: 59 | Admitting: Occupational Therapy

## 2021-01-02 ENCOUNTER — Ambulatory Visit: Payer: 59 | Admitting: Occupational Therapy

## 2021-01-06 ENCOUNTER — Encounter: Payer: 59 | Admitting: Occupational Therapy

## 2021-01-09 ENCOUNTER — Encounter: Payer: 59 | Admitting: Occupational Therapy

## 2021-01-15 ENCOUNTER — Ambulatory Visit: Payer: 59 | Admitting: Orthopaedic Surgery

## 2021-01-15 ENCOUNTER — Encounter: Payer: Self-pay | Admitting: Orthopaedic Surgery

## 2021-01-15 DIAGNOSIS — M7711 Lateral epicondylitis, right elbow: Secondary | ICD-10-CM

## 2021-01-15 NOTE — Progress Notes (Signed)
   Post-Op Visit Note   Patient: Michelle Alexander           Date of Birth: [birth year]           MRN: [id number] Visit Date: 01/15/2021 PCP: Cleatis Polka., MD   Assessment & Plan:  Chief Complaint:  Chief Complaint  Patient presents with  . Right Elbow - Pain   Visit Diagnoses:  1. Lateral epicondylitis, right elbow     Plan:   Michelle Alexander is following up today for her 1-month visit status post right tennis elbow release.  She has been discharged from OT to home exercises and putty.  Overall doing well.  She feels some soreness and some tightness in noticeable weakness but she has no pain with ADLs and she recently started up playing some tennis.  She has done well with these activities.  Right elbow shows a fully healed surgical scar.  She has regained full elbow extension and lacks just a few degrees of full elbow flexion.  She has full pronation supination.  No tenderness around the surgical area.  Strength is improving as well.  At this point she can gradually increase her activities at the gym and with racquet sports.  She will wear body helix elbow compression sleeve with activity for at least the next 6 weeks and then can wean as tolerated.  She can resume unrestricted racquet sports at 6 months postop.  Until then she will work on strengthening and resumption of activities.  Follow-Up Instructions: Return if symptoms worsen or fail to improve.   Orders:  No orders of the defined types were placed in this encounter.  No orders of the defined types were placed in this encounter.   Imaging: No results found.  PMFS History: Patient Active Problem List   Diagnosis Date Noted  . Lateral epicondylitis, right elbow 10/30/2020   History reviewed. No pertinent past medical history.  History reviewed. No pertinent family history.  History reviewed. No pertinent surgical history. Social History   Occupational History  . Not on file  Tobacco Use  . Smoking status: Never  Smoker  . Smokeless tobacco: Never Used  Substance and Sexual Activity  . Alcohol use: Yes  . Drug use: No  . Sexual activity: Not on file

## 2021-09-18 IMAGING — MR MR ELBOW*R* W/O CM
4 of 5 series · 19 of 40 positions shown · non-contrast
Comparison: Radiographs 05/14/2020

CLINICAL DATA: Right elbow pain for 6 months.

EXAM:
MRI OF THE RIGHT ELBOW WITHOUT CONTRAST
TECHNIQUE: Multiplanar, multisequence MR imaging of the elbow was performed. No
intravenous contrast was administered.

[Series 3: T1 · axial · 3.5mm · 0.27mm/px · z∈[-62,+40]mm · 3 of 30 slices shown]
[im 4/30]
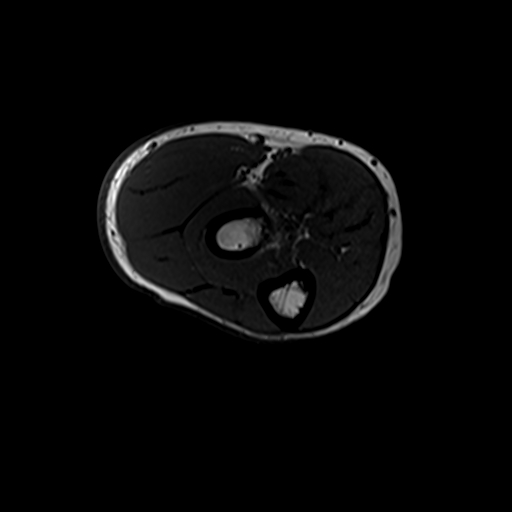
[im 17/30]
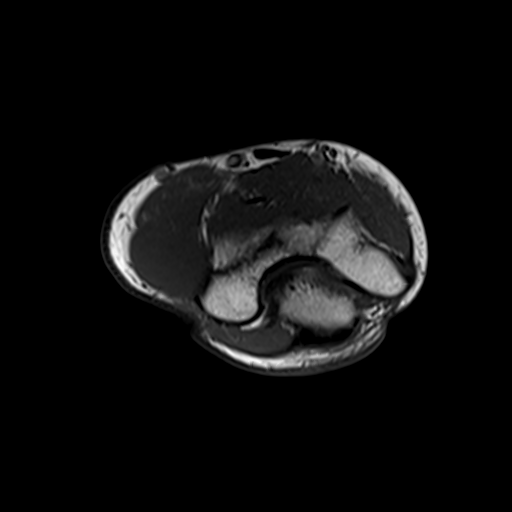
[im 26/30]
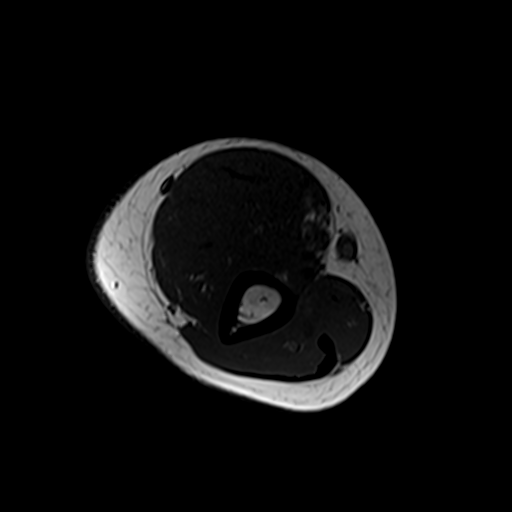

[Series 4: T2 fat-sat · axial · 3.5mm · 0.27mm/px · z∈[-76,+45]mm · 10 of 30 slices shown (1 of 3)]
[im 1/30]
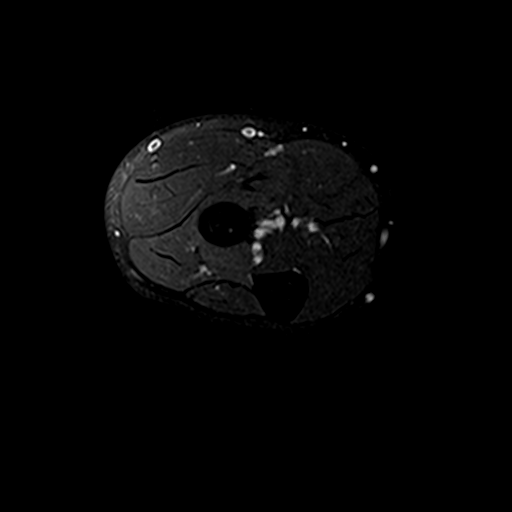
[im 3/30]
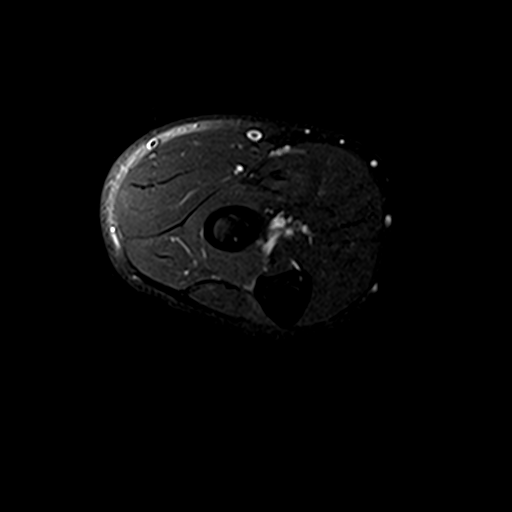
[im 6/30]
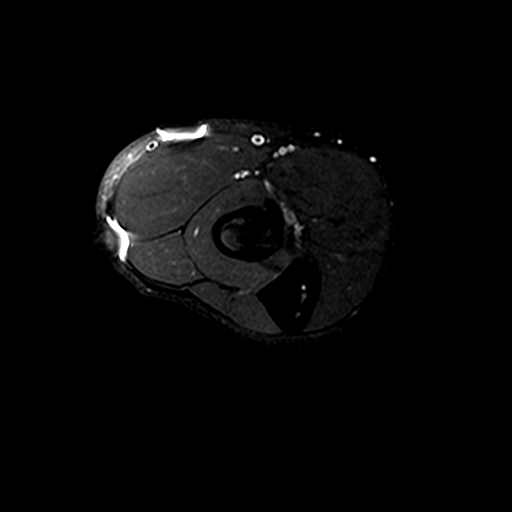
[im 9/30]
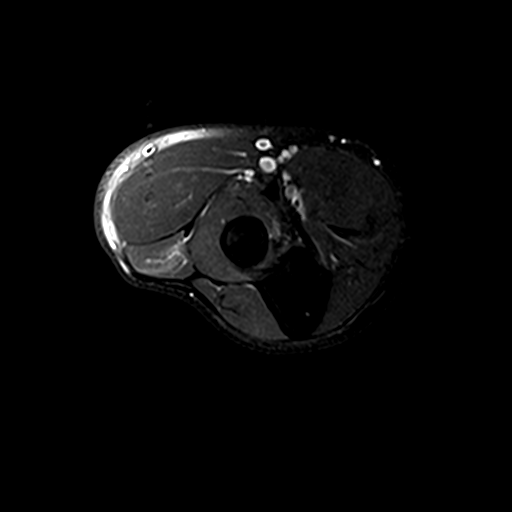
[im 12/30]
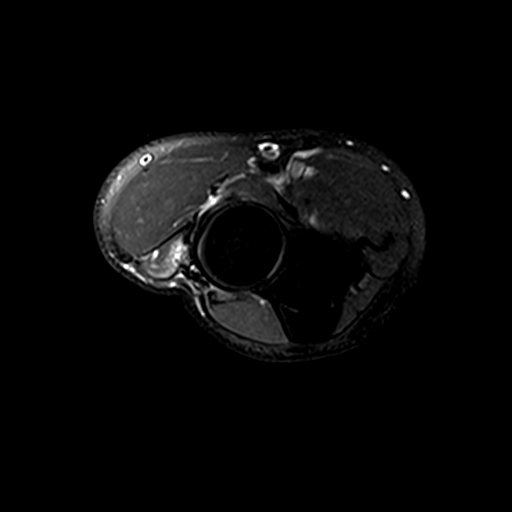
[im 15/30]
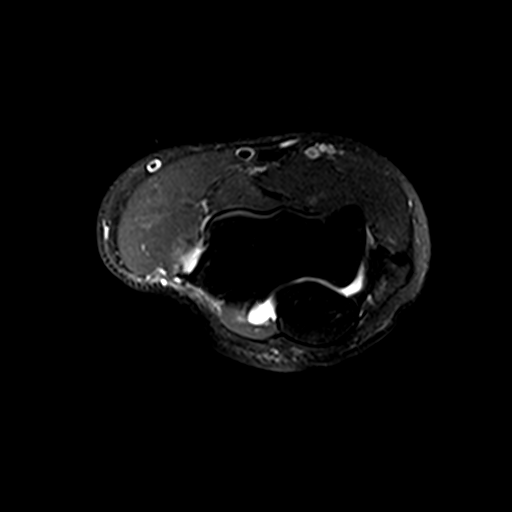
[im 18/30]
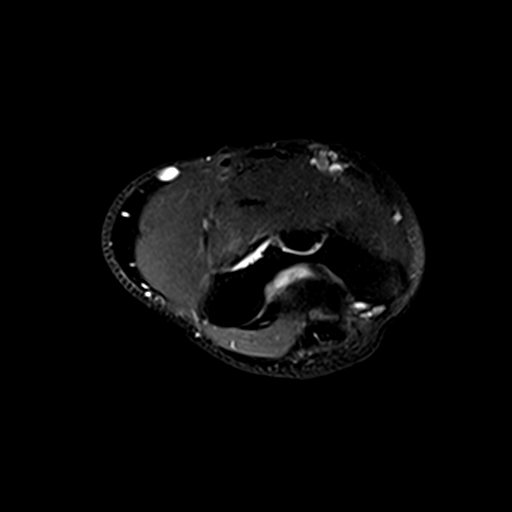
[im 21/30]
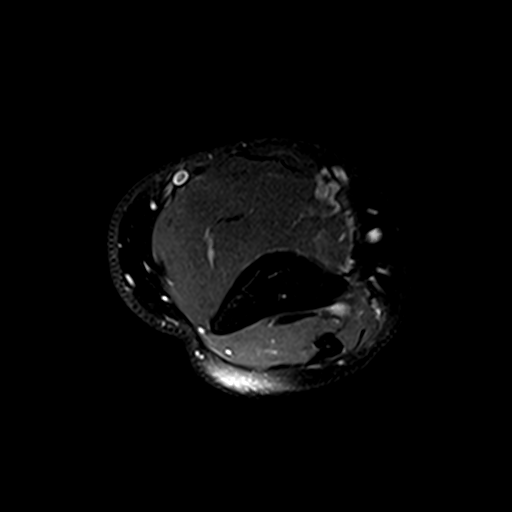
[im 24/30]
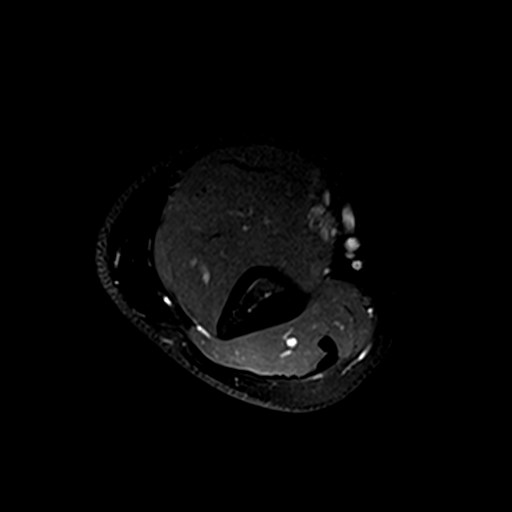
[im 27/30]
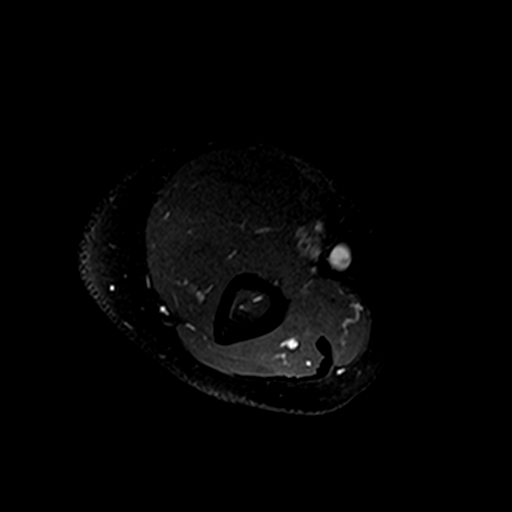

[Series 6: T2 fat-sat · coronal · 3.5mm · 0.31mm/px · 3 of 17 slices shown (2 of 3)]
[im 4/17]
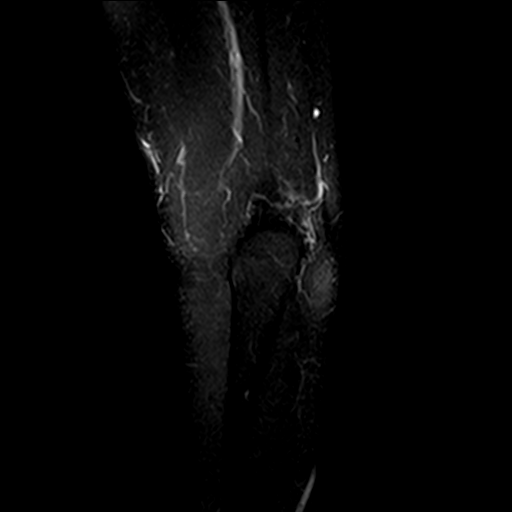
[im 10/17]
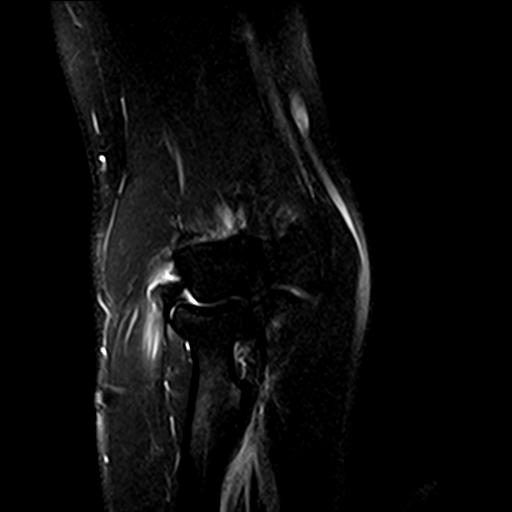
[im 17/17]
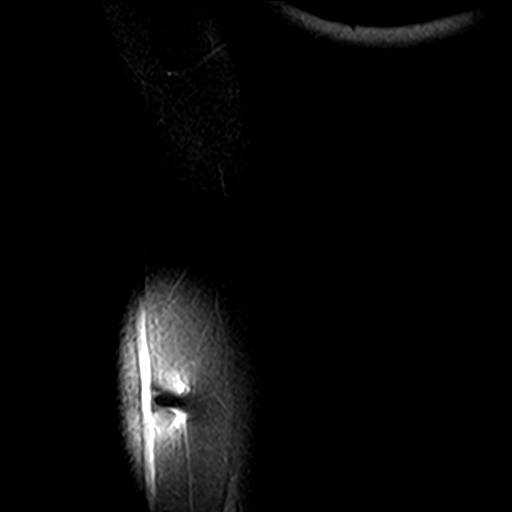

[Series 7: T2 fat-sat · sagittal · 3.5mm · 0.35mm/px · 3 of 20 slices shown (3 of 3)]
[im 4/20]
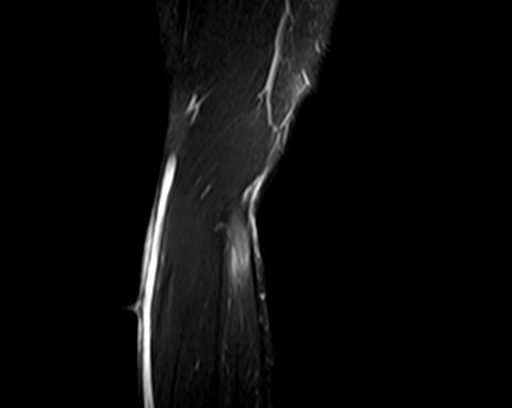
[im 10/20]
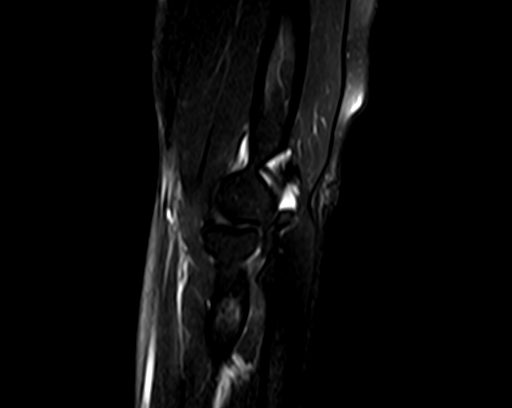
[im 16/20]
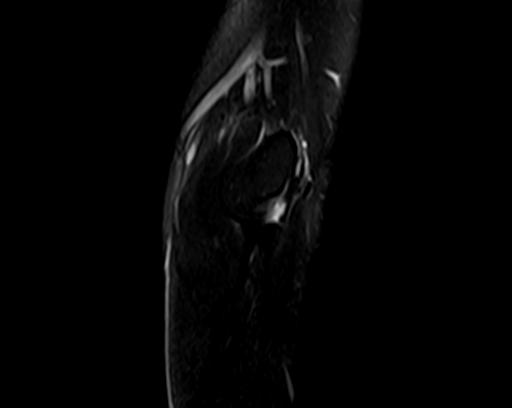

[19 of 40 positions shown; findings below may reference images not displayed]

FINDINGS: TENDONS

Common forearm flexor origin: Intact.  No tendinopathy.

Common forearm extensor origin: High-grade near complete tear of the
common extensor tendon from its attachment site on the lateral
epicondyle. The torn fibers are retracted approximately 7.5 mm. I
think this also involves the superficial fibers of the radial
collateral ligament. There is some associated inflammation/edema or
tearing extending down into the musculature.

Biceps: Intact.  The brachialis tendon is also intact.

Triceps: Intact.

LIGAMENTS

Medial stabilizers: Intact

Lateral stabilizers: Partial thickness tearing of the mid and
superficial fibers of the radial collateral ligament. The deep
fibers appear to be intact.

Cartilage: No cartilage defects or osteochondral lesion.

Joint: Small joint effusion.  No loose bodies.

Cubital tunnel: Normal appearance of the ulnar nerve. No mass or
mass effect.

Bones: No no acute bony findings.  No osteochondral lesion.
IMPRESSION: 1. High-grade near complete tear of the common extensor tendon from
its attachment site on the lateral epicondyle. I think this also
involves the mid and superficial fibers of the underlying radial
collateral ligament.
2. Small joint effusion.
3. No significant bony findings.

## 2022-12-17 ENCOUNTER — Other Ambulatory Visit: Payer: Self-pay | Admitting: Orthopaedic Surgery

## 2022-12-17 MED ORDER — NAPROXEN 500 MG PO TABS: 500.0000 mg | ORAL_TABLET | Freq: Two times a day (BID) | ORAL | 3 refills | Status: DC

## 2022-12-17 MED ORDER — TIZANIDINE HCL 4 MG PO TABS
4.0000 mg | ORAL_TABLET | Freq: Three times a day (TID) | ORAL | 0 refills | Status: DC | PRN
Start: 2022-12-17 — End: 2022-12-18

## 2022-12-17 MED ORDER — PREDNISONE 10 MG (21) PO TBPK: ORAL_TABLET | ORAL | 3 refills | Status: DC

## 2022-12-18 ENCOUNTER — Other Ambulatory Visit: Payer: Self-pay | Admitting: Orthopaedic Surgery

## 2022-12-18 MED ORDER — PREDNISONE 10 MG (21) PO TBPK: ORAL_TABLET | ORAL | 3 refills | Status: DC

## 2022-12-18 MED ORDER — NAPROXEN 500 MG PO TABS: 500.0000 mg | ORAL_TABLET | Freq: Two times a day (BID) | ORAL | 3 refills | Status: DC

## 2022-12-18 MED ORDER — TIZANIDINE HCL 4 MG PO TABS
4.0000 mg | ORAL_TABLET | Freq: Three times a day (TID) | ORAL | 0 refills | Status: DC | PRN
Start: 2022-12-18 — End: 2022-12-19

## 2022-12-19 ENCOUNTER — Other Ambulatory Visit: Payer: Self-pay | Admitting: Orthopaedic Surgery

## 2022-12-19 MED ORDER — NAPROXEN 500 MG PO TABS: 500.0000 mg | ORAL_TABLET | Freq: Two times a day (BID) | ORAL | 3 refills | Status: DC

## 2022-12-19 MED ORDER — PREDNISONE 10 MG (21) PO TBPK: ORAL_TABLET | ORAL | 3 refills | Status: DC

## 2022-12-19 MED ORDER — TIZANIDINE HCL 4 MG PO TABS: 4.0000 mg | ORAL_TABLET | Freq: Three times a day (TID) | ORAL | 0 refills | Status: DC | PRN

## 2022-12-24 ENCOUNTER — Other Ambulatory Visit: Payer: Self-pay | Admitting: Orthopaedic Surgery

## 2023-01-10 ENCOUNTER — Other Ambulatory Visit: Payer: Self-pay | Admitting: Orthopaedic Surgery

## 2023-01-10 MED ORDER — MELOXICAM 15 MG PO TABS: 15.0000 mg | ORAL_TABLET | Freq: Every day | ORAL | 3 refills | Status: DC | PRN

## 2023-04-05 ENCOUNTER — Ambulatory Visit: Payer: 59 | Admitting: Orthopaedic Surgery

## 2023-04-18 NOTE — Progress Notes (Unsigned)
Office Visit Note   Patient: Michelle Alexander           Date of Birth: 1986-03-02           MRN: [id number] Visit Date: 04/19/2023              Requested by: Cleatis Polka., MD [address] [place],  [place] [address] PCP: Cleatis Polka., MD   Assessment & Plan: Visit Diagnoses:  1. Right foot pain     Plan: Cotina is a 37 year old female with what sounds to be right plantar fasciitis.  Overall the symptoms have improved slightly recently.  Recommended night splints and arch supports and activity modification as needed.  Home exercises provided.  Follow-up as needed.  Follow-Up Instructions: No follow-ups on file.   Orders:  No orders of the defined types were placed in this encounter.  No orders of the defined types were placed in this encounter.     Procedures: No procedures performed   Clinical Data: No additional findings.   Subjective: No chief complaint on file.   HPI Michelle Alexander is a 37 year old female who is well-known to me comes in for right heel pain for about 3 months.  Pain is worse in the morning and after physical activity.  She has been using Voltaren gel and naproxen as needed for the symptoms.  Denies any injuries.  Denies any numbness and tingling. Review of Systems  Constitutional: Negative.   HENT: Negative.    Eyes: Negative.   Respiratory: Negative.    Cardiovascular: Negative.   Endocrine: Negative.   Musculoskeletal: Negative.   Neurological: Negative.   Hematological: Negative.   Psychiatric/Behavioral: Negative.    All other systems reviewed and are negative.   Objective: Vital Signs: There were no vitals taken for this visit.  Physical Exam Vitals and nursing note reviewed.  Constitutional:      Appearance: She is well-developed.  HENT:     Head: Normocephalic and atraumatic.  Pulmonary:     Effort: Pulmonary effort is normal.  Abdominal:     Palpations: Abdomen is soft.  Musculoskeletal:     Cervical back: Neck  supple.  Skin:    General: Skin is warm.     Capillary Refill: Capillary refill takes less than 2 seconds.  Neurological:     Mental Status: She is alert and oriented to person, place, and time.  Psychiatric:        Behavior: Behavior normal.        Thought Content: Thought content normal.        Judgment: Judgment normal.   Ortho Exam Examination of the right foot shows no structural abnormalities.  There is no tenderness along the posterior tibial tendon or the Achilles insertion.  She has slight tenderness to the proximal portion of the plantar fascia.  She has excellent range of motion of the ankle. Specialty Comments:  No specialty comments available.  Imaging: No results found.   PMFS History: Patient Active Problem List   Diagnosis Date Noted   Lateral epicondylitis, right elbow 10/30/2020   No past medical history on file.  No family history on file.  No past surgical history on file. Social History   Occupational History   Not on file  Tobacco Use   Smoking status: Never   Smokeless tobacco: Never  Substance and Sexual Activity   Alcohol use: Yes   Drug use: No   Sexual activity: Not on file

## 2023-04-19 ENCOUNTER — Ambulatory Visit (INDEPENDENT_AMBULATORY_CARE_PROVIDER_SITE_OTHER): Payer: 59 | Admitting: Orthopaedic Surgery

## 2023-04-19 ENCOUNTER — Other Ambulatory Visit (INDEPENDENT_AMBULATORY_CARE_PROVIDER_SITE_OTHER): Payer: 59

## 2023-04-19 DIAGNOSIS — M79671 Pain in right foot: Secondary | ICD-10-CM

## 2023-06-28 NOTE — Progress Notes (Unsigned)
    Aleen Sells D[facility] [address] [place] [address] Phone: [phone]   Assessment and Plan:     There are no diagnoses linked to this encounter.  ***   Pertinent previous records reviewed include ***   Follow Up: ***     Subjective:   I, Audris Speaker, am serving as a Neurosurgeon for Doctor [employer]  Chief Complaint: low back, hip and leg pain   HPI:   06/29/2023 Patient is a 37 year old female complaining of low back, hip and leg pain. Patient states  Relevant Historical Information: ***  Additional pertinent review of systems negative.   Current Outpatient Medications:    Diclofenac Sodium (PENNSAID) 2 % SOLN, Apply 2 g topically 2 (two) times daily as needed (to affected area)., Disp: 112 g, Rfl: 3   HYDROcodone-acetaminophen (NORCO) 5-325 MG tablet, Take 1 tablet by mouth 3 (three) times daily as needed., Disp: 30 tablet, Rfl: 0   meloxicam (MOBIC) 15 MG tablet, Take 1 tablet (15 mg total) by mouth daily as needed for pain., Disp: 30 tablet, Rfl: 3   naproxen (NAPROSYN) 500 MG tablet, Take 1 tablet (500 mg total) by mouth 2 (two) times daily with a meal., Disp: 30 tablet, Rfl: 3   ondansetron (ZOFRAN) 4 MG tablet, Take 1 tablet (4 mg total) by mouth every 8 (eight) hours as needed for nausea or vomiting., Disp: 40 tablet, Rfl: 0   oxyCODONE (OXY IR/ROXICODONE) 5 MG immediate release tablet, Take 1-3 tablets (5-15 mg total) by mouth every 4 (four) hours as needed for severe pain., Disp: 30 tablet, Rfl: 0   predniSONE (STERAPRED UNI-PAK 21 TAB) 10 MG (21) TBPK tablet, Take as directed, Disp: 21 tablet, Rfl: 3   tiZANidine (ZANAFLEX) 4 MG tablet, Take 1 tablet (4 mg total) by mouth every 8 (eight) hours as needed for muscle spasms., Disp: 30 tablet, Rfl: 0   Objective:     There were no vitals filed for this visit.    There is no height or weight on file to calculate BMI.    Physical Exam:     ***   Electronically signed by:  Aleen Sells [facility] 7:08 AM 06/28/23

## 2023-06-29 ENCOUNTER — Ambulatory Visit (INDEPENDENT_AMBULATORY_CARE_PROVIDER_SITE_OTHER): Payer: 59 | Admitting: Sports Medicine

## 2023-06-29 VITALS — BP 132/82 | HR 88 | Ht 67.0 in | Wt 136.0 lb

## 2023-06-29 DIAGNOSIS — G8929 Other chronic pain: Secondary | ICD-10-CM | POA: Diagnosis not present

## 2023-06-29 DIAGNOSIS — M5442 Lumbago with sciatica, left side: Secondary | ICD-10-CM

## 2023-06-29 MED ORDER — MELOXICAM 15 MG PO TABS: 15.0000 mg | ORAL_TABLET | Freq: Every day | ORAL | 0 refills | Status: DC

## 2023-06-29 NOTE — Patient Instructions (Addendum)
-   Start meloxicam 15 mg daily x2 weeks.  If still having pain after 2 weeks, complete 3rd-week of meloxicam. May use remaining meloxicam as needed once daily for pain control.  Do not to use additional NSAIDs while taking meloxicam.  May use Tylenol [id number] mg 2 to 3 times a day for breakthrough pain. Glute HEP  2-3 week follow up  Activity as tolerated

## 2023-07-12 NOTE — Progress Notes (Deleted)
    Aleen Sells D.[facility] [address] [place] [address] Phone: [phone]   Assessment and Plan:     There are no diagnoses linked to this encounter.  ***   Pertinent previous records reviewed include ***   Follow Up: ***     Subjective:   I, Kenn Rekowski, am serving as a Neurosurgeon for [employer]   Chief Complaint: low back, hip and leg pain    HPI:    06/29/2023 Patient is a 37 year old female complaining of low back, hip and leg pain. Patient states she has sciatic pain down to her toes. Tried body work, dry needling, back braces. States she is very active. Pain for month. Ibu for the pain. Pain is worse at night and waking up in the am. When she is active the pain is better. She is not doing any impact right now.   07/13/2023 Patient states    Relevant Historical Information: None pertinent  Additional pertinent review of systems negative.   Current Outpatient Medications:    meloxicam (MOBIC) 15 MG tablet, Take 1 tablet (15 mg total) by mouth daily., Disp: 30 tablet, Rfl: 0   Objective:     There were no vitals filed for this visit.    There is no height or weight on file to calculate BMI.    Physical Exam:    ***   Electronically signed by:  Aleen Sells [facility] 7:37 AM 07/12/23

## 2023-07-13 ENCOUNTER — Ambulatory Visit: Payer: 59 | Admitting: Sports Medicine

## 2023-10-03 ENCOUNTER — Other Ambulatory Visit: Payer: Self-pay | Admitting: Orthopaedic Surgery

## 2023-10-28 ENCOUNTER — Ambulatory Visit (INDEPENDENT_AMBULATORY_CARE_PROVIDER_SITE_OTHER): Payer: 59 | Admitting: Sports Medicine

## 2023-10-28 ENCOUNTER — Ambulatory Visit (INDEPENDENT_AMBULATORY_CARE_PROVIDER_SITE_OTHER): Payer: 59

## 2023-10-28 VITALS — BP 120/80 | HR 88 | Ht 67.0 in | Wt 128.0 lb

## 2023-10-28 DIAGNOSIS — M545 Low back pain, unspecified: Secondary | ICD-10-CM | POA: Diagnosis not present

## 2023-10-28 DIAGNOSIS — M5442 Lumbago with sciatica, left side: Secondary | ICD-10-CM | POA: Diagnosis not present

## 2023-10-28 DIAGNOSIS — G8929 Other chronic pain: Secondary | ICD-10-CM | POA: Diagnosis not present

## 2023-10-28 MED ORDER — MELOXICAM 15 MG PO TABS: 15.0000 mg | ORAL_TABLET | Freq: Every day | ORAL | 0 refills | Status: DC

## 2023-10-28 NOTE — Patient Instructions (Signed)
-   Start meloxicam 15 mg daily x2 weeks.  If still having pain after 2 weeks, complete 3rd-week of NSAID. May use remaining NSAID as needed once daily for pain control.  Do not to use additional over-the-counter NSAIDs (ibuprofen, naproxen, Advil, Aleve) while taking prescription NSAIDs.  May use Tylenol [phone] mg 2 to 3 times a day for breakthrough pain. Low back HEP  Pt referral  4 week follow up  No tennis until follow up

## 2023-10-28 NOTE — Progress Notes (Signed)
    Michelle Alexander D.[facility] [address] [place] [address] Phone: [phone]   Assessment and Plan:     1. Chronic bilateral low back pain without sciatica -Chronic with exacerbation, subsequent visit - Most consistent with muscular strain in lumbar region based on HPI and physical exam.  Patient has had recurrent flares over the past 1 year.  Currently no radicular symptoms - Recommend starting HEP and physical therapy.  Referral sent - Start meloxicam 15 mg daily x2 weeks.  If still having pain after 2 weeks, complete 3rd-week of NSAID. May use remaining NSAID as needed once daily for pain control.  Do not to use additional over-the-counter NSAIDs (ibuprofen, naproxen, Advil, Aleve) while taking prescription NSAIDs.  May use Tylenol [phone] mg 2 to 3 times a day for breakthrough pain. -X-ray obtained in clinic.  My interpretation: No acute fracture, dislocation, or vertebral collapse. -Recommend avoiding tennis activities and other activities that flare symptoms  Pertinent previous records reviewed include none  Follow Up: 4 weeks for reevaluation.  Could consider advanced imaging with MRI versus further discussing OMT   Subjective:   I, Michelle Alexander, am serving as a Neurosurgeon for Doctor [employer]   Chief Complaint: low back, hip and leg pain    HPI:    06/29/2023 Patient is a 38 year old female complaining of low back, hip and leg pain. Patient states she has sciatic pain down to her toes. Tried body work, dry needling, back braces. States she is very active. Pain for month. Ibu for the pain. Pain is worse at night and waking up in the am. When she is active the pain is better. She is not doing any impact right now.   10/28/2023 Patient states got better and then pain came back in December played a lot of tennis    Relevant Historical Information: None pertinent    Additional pertinent review of systems negative.   Current  Outpatient Medications:    meloxicam (MOBIC) 15 MG tablet, Take 1 tablet (15 mg total) by mouth daily., Disp: 30 tablet, Rfl: 0   meloxicam (MOBIC) 15 MG tablet, Take 1 tablet (15 mg total) by mouth daily., Disp: 30 tablet, Rfl: 0   tiZANidine (ZANAFLEX) 4 MG tablet, TAKE 1 TABLET(4 MG) BY MOUTH EVERY 8 HOURS AS NEEDED FOR MUSCLE SPASMS, Disp: 30 tablet, Rfl: 0   Objective:     Vitals:   10/28/23 1426  BP: 120/80  Pulse: 88  SpO2: 99%  Weight: 128 lb (58.1 kg)  Height: 5\' 7"  (1.702 m)      Body mass index is 20.05 kg/m.    Physical Exam:    Gen: Appears well, nad, nontoxic and pleasant Psych: Alert and oriented, appropriate mood and affect Neuro: sensation intact, strength is 5/5 in upper and lower extremities, muscle tone wnl Skin: no susupicious lesions or rashes  Back - Normal skin, Spine with normal alignment and no deformity.   No tenderness to vertebral process palpation.   Paraspinous muscles are not tender and without spasm NTTP gluteal musculature, SI joint, sacral base Straight leg raise negative Trendelenberg positive left Piriformis Test negative Gait normal Pain with lumbar flexion, left-sided rotation.  Mild discomfort with lumbar extension.  No pain with right lumbar rotation   Electronically signed by:  Michelle Alexander [facility] 3:47 PM 10/28/23

## 2023-10-31 ENCOUNTER — Encounter: Payer: 59 | Admitting: Physical Medicine and Rehabilitation

## 2023-11-01 ENCOUNTER — Telehealth: Payer: Self-pay | Admitting: Sports Medicine

## 2023-11-01 ENCOUNTER — Other Ambulatory Visit: Payer: Self-pay | Admitting: Sports Medicine

## 2023-11-01 DIAGNOSIS — G8929 Other chronic pain: Secondary | ICD-10-CM

## 2023-11-01 NOTE — Telephone Encounter (Signed)
Patient called asking if we would be able to go ahead and order the MRI for her?

## 2023-11-07 ENCOUNTER — Ambulatory Visit: Payer: 59

## 2023-11-07 DIAGNOSIS — M5442 Lumbago with sciatica, left side: Secondary | ICD-10-CM

## 2023-11-07 DIAGNOSIS — G8929 Other chronic pain: Secondary | ICD-10-CM | POA: Diagnosis not present

## 2023-11-07 DIAGNOSIS — M545 Low back pain, unspecified: Secondary | ICD-10-CM

## 2023-11-08 ENCOUNTER — Encounter: Payer: Self-pay | Admitting: Physical Therapy

## 2023-11-08 ENCOUNTER — Ambulatory Visit: Payer: 59 | Admitting: Physical Therapy

## 2023-11-08 ENCOUNTER — Other Ambulatory Visit: Payer: Self-pay

## 2023-11-08 DIAGNOSIS — R293 Abnormal posture: Secondary | ICD-10-CM

## 2023-11-08 DIAGNOSIS — M5442 Lumbago with sciatica, left side: Secondary | ICD-10-CM

## 2023-11-08 DIAGNOSIS — G8929 Other chronic pain: Secondary | ICD-10-CM

## 2023-11-08 DIAGNOSIS — R29898 Other symptoms and signs involving the musculoskeletal system: Secondary | ICD-10-CM

## 2023-11-08 DIAGNOSIS — M6281 Muscle weakness (generalized): Secondary | ICD-10-CM | POA: Diagnosis not present

## 2023-11-08 NOTE — Therapy (Signed)
OUTPATIENT PHYSICAL THERAPY LOWER EXTREMITY EVALUATION   Patient Name: Michelle Alexander MRN: [id number] DOB:09-20-1986, 38 y.o., female Today's Date: 11/08/2023  END OF SESSION:  PT End of Session - 11/08/23 1026     Visit Number 1    Number of Visits 10    Date for PT Re-Evaluation 12/20/23    Authorization Type [employer] Other    Authorization Time Period 11/08/23 to 12/20/23    PT Start Time 0938   late arrival/check in   PT Stop Time 1013    PT Time Calculation (min) 35 min    Activity Tolerance Patient tolerated treatment well    Behavior During Therapy [facility] for tasks assessed/performed             History reviewed. No pertinent past medical history. History reviewed. No pertinent surgical history. There are no active problems to display for this patient.   PCP: Martha Clan MD   REFERRING PROVIDER: Richardean Sale, DO  REFERRING DIAG: Diagnosis M54.42,G89.29 (ICD-10-CM) - Chronic left-sided low back pain with left-sided sciatica  THERAPY DIAG:  Chronic left-sided low back pain with left-sided sciatica  Muscle weakness (generalized)  Abnormal posture  Other symptoms and signs involving the musculoskeletal system  Rationale for Evaluation and Treatment: Rehabilitation  ONSET DATE: February 2024  SUBJECTIVE:   SUBJECTIVE STATEMENT:  Started last year, normally it hurts from overuse but can't remember an exact moment that started the pain. I play tennis and rotate to the left a lot. When it started we were travelling for my daughter's volleyball tournament and that part of my back really locked up. A provider gave me meloxicam that helped somewhat but never really got formally looked at. Over time this progressed to sciatica going down that leg. Feels like QL and down into glutes. Had a big sciatica flare in September that took a month to go away, time/meloxicam/stopping tennis helped. Feel like its a mix of fight or flight and tight muscles. Had an MRI still  waiting on results.   PERTINENT HISTORY:  See above   PAIN:  Are you having pain? No "not pain right now- not shooting or stabbing just a dull ache and its maybe a 3/10- not comfortable but not debilitating". L lumbar, tennis makes it worse/meloxicam and DN help   PRECAUTIONS: None  RED FLAGS: None   WEIGHT BEARING RESTRICTIONS: No  FALLS:  Has patient fallen in last 6 months? 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 Within 2 weeks   OBJECTIVE:  Note: Objective measures were completed at Evaluation unless otherwise noted.  DIAGNOSTIC FINDINGS:    Narrative & Impression CLINICAL DATA:  Left hip pain.   EXAM: DG HIP (WITH OR WITHOUT PELVIS) 2-3V LEFT   COMPARISON:  None Available.   FINDINGS: There is no evidence of hip fracture or dislocation. Very minimal osteophytosis noted in the superolateral acetabulum.   IMPRESSION: Very minimal degenerative joint changes of left hip.     Electronically Signed   By: Sherian Rein M.D.   On: 10/28/2023 14:40   CLINICAL DATA:  Low back pain since Fall of 2024.   EXAM: LUMBAR SPINE - 2-3 VIEW   COMPARISON:  None  Available.   FINDINGS: There is no evidence of lumbar spine fracture. Curvature of spine. Minimal narrow intervertebral spaces in the lower lumbar spine.   IMPRESSION: Minimal degenerative joint changes of lower lumbar spine.      PATIENT SURVEYS:  Modified Oswestry 15/50   COGNITION: Overall cognitive status: Within functional limits for tasks assessed     SENSATION: Reports sciatica L LE     MUSCLE LENGTH:  HS R moderate limitation, L mild  limitation Piriformis WNL B  Quads moderate limitation B R >L  POSTURE: rounded shoulders, forward head, decreased lumbar lordosis, and increased thoracic kyphosis  PALPATION: No areas TTP, but more mm tension noted L paraspinals   LOWER EXTREMITY ROM:  Lumbar flexion moderate limitation, RFIS some increase in pain  Lumbar extension mild limitation, compensates with hips- REIS no changes   Lumbar lateral flexion WNL   LOWER EXTREMITY MMT:  MMT Right eval Left eval  Hip flexion 4- 3-  Hip extension 4+ 4+  Hip abduction 4+ 4+  Hip adduction    Hip internal rotation    Hip external rotation    Knee flexion 3+ 2+  Knee extension 5 5  Ankle dorsiflexion 5 5  Ankle plantarflexion    Ankle inversion    Ankle eversion     (Blank rows = not tested)  LOWER EXTREMITY SPECIAL TESTS:   SLR test (-) Sciatic tension (-)                                                                                                                                 TREATMENT DATE:   11/08/23  Exam, care planning, HEP, lots of education on possible mode of injury, chronic mm strain vs tear and injury presentation of both, anatomy of region and role of PT in addressing specific targeted area, general discussion of biomechanics, chronic inflammation that can happen with chronic strains/reinjuries, mm imbalance that may be contributing   SKTC 3x5 seconds B Lumbar rotation stretch 3x5 seconds B Childs pose 1x30 seconds     PATIENT EDUCATION:  Education details: as above  Person educated: Patient Education method: Programmer, multimedia, Verbal cues, and Handouts Education comprehension: verbalized understanding, returned demonstration, and needs further education  HOME EXERCISE PROGRAM: Access Code: [email or web address] URL: [email or web address] Date: 11/08/2023 Prepared by: Nedra Hai  Exercises - Supine Single Knee to Chest Stretch  - 2 x daily - 7 x weekly - 1 sets - 5 reps - Supine Lower  Trunk Rotation  - 2 x daily - 7 x weekly - 1 sets - 10 reps - Child's Pose Stretch  - 2 x daily - 7 x weekly - 1 sets - 3 reps  ASSESSMENT:  CLINICAL IMPRESSION: Patient is a 38 y.o. F who was seen today for physical therapy evaluation and treatment for Diagnosis M54.42,G89.29 (ICD-10-CM) - Chronic left-sided low back pain with left-sided sciatica. Exam with objectives as above, based on findings I do think  this is most likely a chronic mm strain of lumbar paraspinals. She has had some dry needling and acupuncture from private providers with limited relief, responded well to interventions today. Will benefit from skilled PT services to address objective findings and assist in pain free return to intense tennis routine.   OBJECTIVE IMPAIRMENTS: decreased ROM, decreased strength, hypomobility, increased fascial restrictions, increased muscle spasms, impaired flexibility, impaired sensation, improper body mechanics, postural dysfunction, and pain.   ACTIVITY LIMITATIONS: carrying, lifting, bending, sitting, bathing, toileting, dressing, and locomotion level  PARTICIPATION LIMITATIONS: meal prep, cleaning, laundry, driving, shopping, and community activity  PERSONAL FACTORS: Behavior pattern, Education, Past/current experiences, and Time since onset of injury/illness/exacerbation are also affecting patient's functional outcome.   REHAB POTENTIAL: Excellent  CLINICAL DECISION MAKING: Stable/uncomplicated  EVALUATION COMPLEXITY: Low   GOALS: Goals reviewed with patient? No  SHORT TERM GOALS: Target date: 11/29/2023   Will be compliant with appropriate progressive HEP  Baseline: Goal status: INITIAL  2.  Will demonstrate high quality biomechanics for floor to waist lifting and for turning with a load  Baseline:  Goal status: INITIAL  3.  Will demonstrate improved awareness of posture in all functional situations with use of ergonomic aides as desired  Baseline:  Goal status:  INITIAL    LONG TERM GOALS: Target date: 12/20/2023    MMT to be 5/5 all tested groups  Baseline:  Goal status: INITIAL  2.  Lumbar ROM and LE flexibility to have normalized  Baseline:  Goal status: INITIAL  3.  Pain to be no more than 2/10 at worst with all functional tasks  Baseline:  Goal status: INITIAL  4.  Will be able to drive/sit without limitation and no increase from resting pain levels  Baseline:  Goal status: INITIAL  5.  Will have returned to tennis as desired with pain no more than 2/10 at worst  Baseline:  Goal status: INITIAL  6.  Oswestry to have improved by at least 10% in order to show improved QOL/subjective outcomes  Baseline:  Goal status: INITIAL   PLAN:  PT FREQUENCY: 1-2x/week  PT DURATION: 6 weeks  PLANNED INTERVENTIONS: 97164- PT Re-evaluation, 97110-Therapeutic exercises, 97530- Therapeutic activity, 97535- Self Care, [address]- Manual therapy, [id number]- Aquatic Therapy, 97014- Electrical stimulation (unattended), [id number]- Ultrasound, 60454- Traction (mechanical), [id number]- Ionotophoresis 4mg /ml Dexamethasone, Taping, Dry Needling, Spinal mobilization, Cryotherapy, and Moist heat  PLAN FOR NEXT SESSION: lumbar mobility and paraspinal/core strength as appropriate for likely chronic mm strain/overuse injury. Pt requesting we incorporate dry needling. Need to work on biomechanics.   Nedra Hai, PT, DPT 11/08/23 10:29 AM

## 2023-11-09 ENCOUNTER — Telehealth: Payer: Self-pay | Admitting: Physical Therapy

## 2023-11-09 NOTE — Telephone Encounter (Signed)
PATIENT IS NEEDING TO RESCHEDULE HER APPT FOR HER PT APPT TOMORROW I WAS UNABLE TO RESCHEDULE SHE NEEDS A CALL BACK TODAY

## 2023-11-09 NOTE — Telephone Encounter (Signed)
Tammy can you take care of this?

## 2023-11-10 ENCOUNTER — Encounter: Payer: 59 | Admitting: Physical Therapy

## 2023-11-14 ENCOUNTER — Ambulatory Visit (INDEPENDENT_AMBULATORY_CARE_PROVIDER_SITE_OTHER): Payer: 59 | Admitting: Sports Medicine

## 2023-11-14 DIAGNOSIS — G8929 Other chronic pain: Secondary | ICD-10-CM | POA: Diagnosis not present

## 2023-11-14 DIAGNOSIS — M5442 Lumbago with sciatica, left side: Secondary | ICD-10-CM | POA: Diagnosis not present

## 2023-11-14 DIAGNOSIS — M51369 Other intervertebral disc degeneration, lumbar region without mention of lumbar back pain or lower extremity pain: Secondary | ICD-10-CM

## 2023-11-14 NOTE — Progress Notes (Unsigned)
   Michelle Alexander - 38 y.o. female MRN [id number]  Date of birth: March 28, 1986  Office Visit Note: Visit Date: 11/14/2023 PCP: Cleatis Polka., MD Referred by: Cleatis Polka., MD  Subjective: No chief complaint on file.  HPI: Michelle Alexander is a pleasant 38 y.o. female who presents today for ***  Pertinent ROS were reviewed with the patient and found to be negative unless otherwise specified above in HPI.   Assessment & Plan: Visit Diagnoses: No diagnosis found.  Plan: ***  Follow-up: No follow-ups on file.   Meds & Orders: No orders of the defined types were placed in this encounter.  No orders of the defined types were placed in this encounter.    Procedures: No procedures performed      Clinical History: No specialty comments available.  She reports that she has never smoked. She has never used smokeless tobacco. No results for input(s): "HGBA1C", "LABURIC" in the last 8760 hours.  Objective:   Vital Signs: There were no vitals taken for this visit.  Physical Exam  Gen: Well-appearing, in no acute distress; non-toxic CV: Well-perfused. Warm.  Resp: Breathing unlabored on room air; no wheezing. Psych: Fluid speech in conversation; appropriate affect; normal thought process  Ortho Exam - ***  Imaging: No results found.  Past Medical/Family/Surgical/Social History: Medications & Allergies reviewed per EMR, new medications updated. There are no active problems to display for this patient.  No past medical history on file. No family history on file. No past surgical history on file. Social History   Occupational History   Not on file  Tobacco Use   Smoking status: Never   Smokeless tobacco: Never  Substance and Sexual Activity   Alcohol use: Yes   Drug use: No   Sexual activity: Not on file

## 2023-11-14 NOTE — Progress Notes (Unsigned)
Patient says that she has had low back pain that comes and goes for about 1 year. She says that in October she also had numbness that went down to the left foot, but she has not experienced that since then. She says that now she feels pain in the left side of the low back and pain in the left glutes. Today she is about 3 weeks into her most recent flare up of pain. She says that she typically has flare ups when she plays tennis. She uses Meloxicam as needed during flare ups, or if she knows she will be traveling, but does not take it consistently. She also uses ice and heat. She says that she has had dry needling and body work done; she does dry needling typically once a month but will go 2-3 times a week during a flare up. She has her first session of physical therapy this week.

## 2023-11-15 ENCOUNTER — Encounter: Payer: Self-pay | Admitting: Sports Medicine

## 2023-11-15 ENCOUNTER — Encounter: Payer: 59 | Admitting: Physical Therapy

## 2023-11-17 ENCOUNTER — Encounter: Payer: 59 | Admitting: Physical Therapy

## 2023-11-18 ENCOUNTER — Encounter: Payer: Self-pay | Admitting: Physical Therapy

## 2023-11-18 ENCOUNTER — Ambulatory Visit: Payer: 59 | Admitting: Physical Therapy

## 2023-11-18 DIAGNOSIS — M6281 Muscle weakness (generalized): Secondary | ICD-10-CM

## 2023-11-18 DIAGNOSIS — M5442 Lumbago with sciatica, left side: Secondary | ICD-10-CM | POA: Diagnosis not present

## 2023-11-18 DIAGNOSIS — G8929 Other chronic pain: Secondary | ICD-10-CM | POA: Diagnosis not present

## 2023-11-18 NOTE — Therapy (Signed)
 OUTPATIENT PHYSICAL THERAPY LOWER EXTREMITY TREATMENT   Patient Name: Michelle Alexander MRN: [id number] DOB:12/12/1985, 38 y.o., female Today's Date: 11/18/2023  END OF SESSION:  PT End of Session - 11/18/23 1031     Visit Number 2    Number of Visits 10    Date for PT Re-Evaluation 12/20/23    Authorization Type U[employer] Other    Authorization Time Period 11/08/23 to 12/20/23    PT Start Time 1032    PT Stop Time 1105    PT Time Calculation (min) 33 min    Activity Tolerance Patient tolerated treatment well    Behavior During Therapy [facility] for tasks assessed/performed             History reviewed. No pertinent past medical history. History reviewed. No pertinent surgical history. There are no active problems to display for this patient.   PCP: Loreli Fallow MD   REFERRING PROVIDER: Leonce Katz, DO  REFERRING DIAG: Diagnosis M54.42,G89.29 (ICD-10-CM) - Chronic left-sided low back pain with left-sided sciatica  THERAPY DIAG:  Chronic left-sided low back pain with left-sided sciatica  Muscle weakness (generalized)  Rationale for Evaluation and Treatment: Rehabilitation  ONSET DATE: February 2024  SUBJECTIVE:   SUBJECTIVE STATEMENT: Pt 15 min late to appt today. She has less pain, but continued soreness in low back and decreased ability for regular activities. Sitting/driving in car is aggravating. She continues to do ok with pilates.    Started last year, normally it hurts from overuse but can't remember an exact moment that started the pain. I play tennis and rotate to the left a lot. When it started we were travelling for my daughter's volleyball tournament and that part of my back really locked up. A provider gave me meloxicam  that helped somewhat but never really got formally looked at. Over time this progressed to sciatica going down that leg. Feels like QL and down into glutes. Had a big sciatica flare in September that took a month to go away,  time/meloxicam /stopping tennis helped. Feel like its a mix of fight or flight and tight muscles. Had an MRI still waiting on results.   PERTINENT HISTORY:  See above   PAIN:  Are you having pain? No not pain right now- not shooting or stabbing just a dull ache and its maybe a 3/10- not comfortable but not debilitating. L lumbar, tennis makes it worse/meloxicam  and DN help   PRECAUTIONS: None  RED FLAGS: None   WEIGHT BEARING RESTRICTIONS: No  FALLS:  Has patient fallen in last 6 months? 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 Within 2 weeks   OBJECTIVE:  Note: Objective measures were completed at Evaluation unless otherwise noted.  DIAGNOSTIC FINDINGS:    Narrative & Impression CLINICAL DATA:  Left hip pain.   EXAM: DG HIP (WITH OR WITHOUT PELVIS) 2-3V LEFT   COMPARISON:  None Available.   FINDINGS: There is no evidence of hip fracture or dislocation. Very minimal osteophytosis noted in the superolateral acetabulum.   IMPRESSION: Very minimal degenerative joint changes of left hip.     Electronically Signed   By: Craig Farr M.D.   On: 10/28/2023 14:40   CLINICAL DATA:  Low back pain since Fall of 2024.   EXAM: LUMBAR SPINE - 2-3 VIEW   COMPARISON:  None Available.   FINDINGS: There is no evidence of lumbar spine fracture. Curvature of spine. Minimal narrow intervertebral spaces in the lower lumbar spine.   IMPRESSION: Minimal degenerative joint changes of lower lumbar spine.   PATIENT SURVEYS:  Modified Oswestry 15/50   COGNITION: Overall cognitive status: Within functional limits for tasks  assessed     SENSATION: Reports sciatica L LE     MUSCLE LENGTH:  HS R moderate limitation, L mild limitation Piriformis WNL B  Quads moderate limitation B R >L  POSTURE: rounded shoulders, forward head, decreased lumbar lordosis, and increased thoracic kyphosis  PALPATION: No areas TTP, but more mm tension noted L paraspinals   LOWER EXTREMITY ROM:  Lumbar flexion moderate limitation, RFIS some increase in pain  Lumbar extension mild limitation, compensates with hips- REIS no changes   Lumbar lateral flexion WNL   LOWER EXTREMITY MMT:  MMT Right eval Left eval  Hip flexion 4- 3-  Hip extension 4+ 4+  Hip abduction 4+ 4+  Hip adduction    Hip internal rotation    Hip external rotation    Knee flexion 3+ 2+  Knee extension 5 5  Ankle dorsiflexion 5 5  Ankle plantarflexion    Ankle inversion    Ankle eversion     (Blank rows = not tested)  LOWER EXTREMITY SPECIAL TESTS:   SLR test (-) Sciatic tension (-)                                                                                                                                 TREATMENT DATE:   11/18/23:  Therapeutic Exercise: Aerobic: Supine: Seated: Standing: Stretches:  Neuromuscular Re-education: prone laying x 2 min with education on positioning for decreasing pain and offloading back,  Prone press ups to elbows 2 x 12 with education on optimal form and purpose of exercise.  Standing lumbar extension: 2 x 10 cueing for form and when to use. Bridging 2 x 10- cueing for optimal form and glute activation.  Other education on keeping car seat higher if able for less flexion.  Manual Therapy: long leg distraction for L LE,  lumbar PA mobs.  Therapeutic Activity: Self Care:    PATIENT EDUCATION:  Education details: as above  Person educated: Patient Education method: Programmer, Multimedia, Verbal cues, and Handouts Education comprehension: verbalized understanding, returned demonstration, and needs  further education  HOME EXERCISE PROGRAM: Access Code: [email or web address] URL: [email or web address] Date: 11/08/2023 Prepared by: Josette Rough  Exercises - Supine Single Knee to Chest Stretch  - 2 x daily - 7 x weekly - 1 sets - 5 reps - Supine Lower Trunk Rotation  - 2 x daily - 7 x weekly - 1 sets - 10 reps - Child's Pose Stretch  - 2 x daily - 7 x weekly - 1 sets -  3 reps  ASSESSMENT:  CLINICAL IMPRESSION: 11/18/23: Pt instructed in prone progression today, starting with press ups to elbows. Pt with no pain in this position today. Discussed frequency  and benefits of performing. Reviewed trying to continue to offload back, limit sitting time, and raise chair seat height if able. Pt with good tolerance for manual distraction as well as NMR today. Plan to progress as able, discussed importance of attendance for optimal outcome.   Eval: Patient is a 38 y.o. F who was seen today for physical therapy evaluation and treatment for Diagnosis M54.42,G89.29 (ICD-10-CM) - Chronic left-sided low back pain with left-sided sciatica. Exam with objectives as above, based on findings I do think this is most likely a chronic mm strain of lumbar paraspinals. She has had some dry needling and acupuncture from private providers with limited relief, responded well to interventions today. Will benefit from skilled PT services to address objective findings and assist in pain free return to intense tennis routine.   OBJECTIVE IMPAIRMENTS: decreased ROM, decreased strength, hypomobility, increased fascial restrictions, increased muscle spasms, impaired flexibility, impaired sensation, improper body mechanics, postural dysfunction, and pain.   ACTIVITY LIMITATIONS: carrying, lifting, bending, sitting, bathing, toileting, dressing, and locomotion level  PARTICIPATION LIMITATIONS: meal prep, cleaning, laundry, driving, shopping, and community activity  PERSONAL FACTORS: Behavior pattern, Education, Past/current  experiences, and Time since onset of injury/illness/exacerbation are also affecting patient's functional outcome.   REHAB POTENTIAL: Excellent  CLINICAL DECISION MAKING: Stable/uncomplicated  EVALUATION COMPLEXITY: Low   GOALS: Goals reviewed with patient? No  SHORT TERM GOALS: Target date: 11/29/2023   Will be compliant with appropriate progressive HEP  Baseline: Goal status: INITIAL  2.  Will demonstrate high quality biomechanics for floor to waist lifting and for turning with a load  Baseline:  Goal status: INITIAL  3.  Will demonstrate improved awareness of posture in all functional situations with use of ergonomic aides as desired  Baseline:  Goal status: INITIAL    LONG TERM GOALS: Target date: 12/20/2023    MMT to be 5/5 all tested groups  Baseline:  Goal status: INITIAL  2.  Lumbar ROM and LE flexibility to have normalized  Baseline:  Goal status: INITIAL  3.  Pain to be no more than 2/10 at worst with all functional tasks  Baseline:  Goal status: INITIAL  4.  Will be able to drive/sit without limitation and no increase from resting pain levels  Baseline:  Goal status: INITIAL  5.  Will have returned to tennis as desired with pain no more than 2/10 at worst  Baseline:  Goal status: INITIAL  6.  Oswestry to have improved by at least 10% in order to show improved QOL/subjective outcomes  Baseline:  Goal status: INITIAL   PLAN:  PT FREQUENCY: 1-2x/week  PT DURATION: 6 weeks  PLANNED INTERVENTIONS: 97164- PT Re-evaluation, 97110-Therapeutic exercises, 97530- Therapeutic activity, 97535- Self Care, 02859- Manual therapy, [id number]- Aquatic Therapy, 97014- Electrical stimulation (unattended), [id number]- Ultrasound, 02987- Traction (mechanical), [id number]- Ionotophoresis 4mg /ml Dexamethasone, Taping, Dry Needling, Spinal mobilization, Cryotherapy, and Moist heat  PLAN FOR NEXT SESSION: lumbar mobility and paraspinal/core strength as appropriate for likely chronic mm  strain/overuse injury. Pt requesting we incorporate dry needling. Need to work on biomechanics.   Tinnie Don, PT, DPT 11:59 AM  11/18/23

## 2023-11-21 ENCOUNTER — Encounter: Payer: 59 | Admitting: Physical Therapy

## 2023-11-22 ENCOUNTER — Telehealth: Payer: Self-pay | Admitting: Physical Medicine and Rehabilitation

## 2023-11-22 NOTE — Telephone Encounter (Signed)
Pt called requesting a call to set appt for injection.  Pt referral was sent today. Please call pt when approved at [phone].

## 2023-11-22 NOTE — Progress Notes (Deleted)
    Aleen Sells D.[facility] [address] [place] [address] Phone: [phone]   Assessment and Plan:     There are no diagnoses linked to this encounter.  ***   Pertinent previous records reviewed include ***    Follow Up: ***     Subjective:   I, Wesson Stith, am serving as a Neurosurgeon for Doctor [employer]   Chief Complaint: low back, hip and leg pain    HPI:    06/29/2023 Patient is a 38 year old female complaining of low back, hip and leg pain. Patient states she has sciatic pain down to her toes. Tried body work, dry needling, back braces. States she is very active. Pain for month. Ibu for the pain. Pain is worse at night and waking up in the am. When she is active the pain is better. She is not doing any impact right now.    10/28/2023 Patient states got better and then pain came back in December played a lot of tennis   11/23/2023 Patient states   Relevant Historical Information: None pertinent  Additional pertinent review of systems negative.   Current Outpatient Medications:    meloxicam (MOBIC) 15 MG tablet, Take 1 tablet (15 mg total) by mouth daily., Disp: 30 tablet, Rfl: 0   meloxicam (MOBIC) 15 MG tablet, Take 1 tablet (15 mg total) by mouth daily., Disp: 30 tablet, Rfl: 0   tiZANidine (ZANAFLEX) 4 MG tablet, TAKE 1 TABLET(4 MG) BY MOUTH EVERY 8 HOURS AS NEEDED FOR MUSCLE SPASMS, Disp: 30 tablet, Rfl: 0   Objective:     There were no vitals filed for this visit.    There is no height or weight on file to calculate BMI.    Physical Exam:    ***   Electronically signed by:  Aleen Sells [facility] 7:37 AM 11/22/23

## 2023-11-23 ENCOUNTER — Ambulatory Visit: Payer: 59 | Admitting: Sports Medicine

## 2023-11-24 ENCOUNTER — Ambulatory Visit: Payer: 59 | Admitting: Physical Therapy

## 2023-11-24 ENCOUNTER — Encounter: Payer: 59 | Admitting: Physical Therapy

## 2023-11-24 ENCOUNTER — Encounter: Payer: Self-pay | Admitting: Physical Therapy

## 2023-11-24 DIAGNOSIS — G8929 Other chronic pain: Secondary | ICD-10-CM

## 2023-11-24 DIAGNOSIS — M5442 Lumbago with sciatica, left side: Secondary | ICD-10-CM

## 2023-11-24 DIAGNOSIS — M6281 Muscle weakness (generalized): Secondary | ICD-10-CM

## 2023-11-24 NOTE — Therapy (Signed)
OUTPATIENT PHYSICAL THERAPY LOWER EXTREMITY TREATMENT   Patient Name: Michelle Alexander MRN: [id number] DOB:[birth year], 38 y.o., female Today's Date: 11/24/2023  END OF SESSION:  PT End of Session - 11/24/23 1610     Visit Number 3    Number of Visits 10    Date for PT Re-Evaluation 12/20/23    Authorization Type UHC Other    Authorization Time Period 11/08/23 to 12/20/23    PT Start Time 1610    PT Stop Time 1645    PT Time Calculation (min) 35 min    Activity Tolerance Patient tolerated treatment well    Behavior During Therapy [facility] for tasks assessed/performed             History reviewed. No pertinent past medical history. History reviewed. No pertinent surgical history. There are no active problems to display for this patient.   PCP: Martha Clan MD   REFERRING PROVIDER: Richardean Sale, DO  REFERRING DIAG: Diagnosis M54.42,G89.29 (ICD-10-CM) - Chronic left-sided low back pain with left-sided sciatica  THERAPY DIAG:  Chronic left-sided low back pain with left-sided sciatica  Muscle weakness (generalized)  Rationale for Evaluation and Treatment: Rehabilitation  ONSET DATE: February 2024  SUBJECTIVE:   SUBJECTIVE STATEMENT: Pt 10 min late to appt today. She continues to have less pain. Doing well with daily activities, still not doing all regular activities and workouts. Has been able to do pilates.   Started last year, normally it hurts from overuse but can't remember an exact moment that started the pain. I play tennis and rotate to the left a lot. When it started we were travelling for my daughter's volleyball tournament and that part of my back really locked up. A provider gave me meloxicam that helped somewhat but never really got formally looked at. Over time this progressed to sciatica going down that leg. Feels like QL and down into glutes. Had a big sciatica flare in September that took a month to go away, time/meloxicam/stopping tennis helped. Feel like  its a mix of fight or flight and tight muscles. Had an MRI still waiting on results.   PERTINENT HISTORY:  See above   PAIN:  Are you having pain? No "not pain right now- not shooting or stabbing just a dull ache and its maybe a 3/10- not comfortable but not debilitating". L lumbar, tennis makes it worse/meloxicam and DN help   PRECAUTIONS: None  RED FLAGS: None   WEIGHT BEARING RESTRICTIONS: No  FALLS:  Has patient fallen in last 6 months? 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 Within 2 weeks   OBJECTIVE:  Note: Objective measures were completed at Evaluation unless otherwise noted.  DIAGNOSTIC FINDINGS:    Narrative & Impression CLINICAL DATA:  Left hip pain.   EXAM: DG HIP (WITH OR WITHOUT PELVIS) 2-3V LEFT   COMPARISON:  None Available.   FINDINGS: There is no evidence of hip fracture or dislocation. Very minimal osteophytosis noted in the superolateral acetabulum.   IMPRESSION: Very minimal degenerative joint changes of left hip.     Electronically Signed   By: Sherian Rein M.D.   On: 10/28/2023 14:40   CLINICAL DATA:  Low back pain  since Fall of 2024.   EXAM: LUMBAR SPINE - 2-3 VIEW   COMPARISON:  None Available.   FINDINGS: There is no evidence of lumbar spine fracture. Curvature of spine. Minimal narrow intervertebral spaces in the lower lumbar spine.   IMPRESSION: Minimal degenerative joint changes of lower lumbar spine.   PATIENT SURVEYS:  Modified Oswestry 15/50   COGNITION: Overall cognitive status: Within functional limits for tasks assessed     SENSATION: Reports sciatica L LE     MUSCLE  LENGTH:  HS R moderate limitation, L mild limitation Piriformis WNL B  Quads moderate limitation B R >L  POSTURE: rounded shoulders, forward head, decreased lumbar lordosis, and increased thoracic kyphosis  PALPATION: No areas TTP, but more mm tension noted L paraspinals   LOWER EXTREMITY ROM:  Lumbar flexion moderate limitation, RFIS some increase in pain  Lumbar extension mild limitation, compensates with hips- REIS no changes   Lumbar lateral flexion WNL   LOWER EXTREMITY MMT:  MMT Right eval Left eval  Hip flexion 4- 3-  Hip extension 4+ 4+  Hip abduction 4+ 4+  Hip adduction    Hip internal rotation    Hip external rotation    Knee flexion 3+ 2+  Knee extension 5 5  Ankle dorsiflexion 5 5  Ankle plantarflexion    Ankle inversion    Ankle eversion     (Blank rows = not tested)  LOWER EXTREMITY SPECIAL TESTS:   SLR test (-) Sciatic tension (-)                                                                                                                                 TREATMENT DATE:   11/24/23:  Therapeutic Exercise: Aerobic: Supine: prone hip ext 2 x 10;  Seated: Standing: Stretches:  Neuromuscular Re-education: Prone press ups to elbows 2 x 10 with education on optimal form and purpose of exercise.  Standing lumbar extension: x10;  side glides to the Right 2 x 10/slow;  Bridging 2 x 10 -  cueing for optimal form and glute activation.  Manual Therapy: long leg distraction for L LE,  lumbar PA mobs.  Therapeutic Activity: squats x10 no weight,  x 10 (15lb ) with education on form; education on bend/lift/squat for home and IADLS. Reviewed back position with other lifting, row, overhead press,  Self Care:   PATIENT EDUCATION:  Education details: updated and reviewed HEP Person educated: Patient Education method: Explanation, Verbal cues, and Handouts Education comprehension: verbalized understanding, returned demonstration, and needs further  education  HOME EXERCISE PROGRAM: Access Code: [email or web address]  Exercises - Supine Single Knee to Chest Stretch  - 2 x daily - 7 x weekly - 1 sets - 5 reps - Supine Lower Trunk Rotation  - 2 x daily - 7 x weekly - 1 sets - 10 reps - Child's Pose Stretch  - 2 x daily - 7 x weekly - 1 sets -  3 reps  ASSESSMENT:  CLINICAL IMPRESSION: 11/24/23: Continued education on best exercises to continue. Reviewed side glides today. Education and practice for squat, bend motions, pt with good ability and form. No pain with hip hinge, squat or step ups today. Discussed not over doing activity at this time. Plan to progress strength as able.    Eval: Patient is a 38 y.o. F who was seen today for physical therapy evaluation and treatment for Diagnosis M54.42,G89.29 (ICD-10-CM) - Chronic left-sided low back pain with left-sided sciatica. Exam with objectives as above, based on findings I do think this is most likely a chronic mm strain of lumbar paraspinals. She has had some dry needling and acupuncture from private providers with limited relief, responded well to interventions today. Will benefit from skilled PT services to address objective findings and assist in pain free return to intense tennis routine.   OBJECTIVE IMPAIRMENTS: decreased ROM, decreased strength, hypomobility, increased fascial restrictions, increased muscle spasms, impaired flexibility, impaired sensation, improper body mechanics, postural dysfunction, and pain.   ACTIVITY LIMITATIONS: carrying, lifting, bending, sitting, bathing, toileting, dressing, and locomotion level  PARTICIPATION LIMITATIONS: meal prep, cleaning, laundry, driving, shopping, and community activity  PERSONAL FACTORS: Behavior pattern, Education, Past/current experiences, and Time since onset of injury/illness/exacerbation are also affecting patient's functional outcome.   REHAB POTENTIAL: Excellent  CLINICAL DECISION MAKING: Stable/uncomplicated  EVALUATION COMPLEXITY:  Low   GOALS: Goals reviewed with patient? No  SHORT TERM GOALS: Target date: 11/29/2023   Will be compliant with appropriate progressive HEP  Baseline: Goal status: INITIAL  2.  Will demonstrate high quality biomechanics for floor to waist lifting and for turning with a load  Baseline:  Goal status: INITIAL  3.  Will demonstrate improved awareness of posture in all functional situations with use of ergonomic aides as desired  Baseline:  Goal status: INITIAL    LONG TERM GOALS: Target date: 12/20/2023    MMT to be 5/5 all tested groups  Baseline:  Goal status: INITIAL  2.  Lumbar ROM and LE flexibility to have normalized  Baseline:  Goal status: INITIAL  3.  Pain to be no more than 2/10 at worst with all functional tasks  Baseline:  Goal status: INITIAL  4.  Will be able to drive/sit without limitation and no increase from resting pain levels  Baseline:  Goal status: INITIAL  5.  Will have returned to tennis as desired with pain no more than 2/10 at worst  Baseline:  Goal status: INITIAL  6.  Oswestry to have improved by at least 10% in order to show improved QOL/subjective outcomes  Baseline:  Goal status: INITIAL   PLAN:  PT FREQUENCY: 1-2x/week  PT DURATION: 6 weeks  PLANNED INTERVENTIONS: 97164- PT Re-evaluation, 97110-Therapeutic exercises, 97530- Therapeutic activity, 97535- Self Care, 16109- Manual therapy, [id number]- Aquatic Therapy, 97014- Electrical stimulation (unattended), [id number]- Ultrasound, 60454- Traction (mechanical), [id number]- Ionotophoresis 4mg /ml Dexamethasone, Taping, Dry Needling, Spinal mobilization, Cryotherapy, and Moist heat  PLAN FOR NEXT SESSION:   Sedalia Muta, PT, DPT 4:49 PM  11/24/23

## 2023-11-25 ENCOUNTER — Encounter: Payer: Self-pay | Admitting: Sports Medicine

## 2023-11-29 ENCOUNTER — Other Ambulatory Visit: Payer: Self-pay

## 2023-11-29 ENCOUNTER — Ambulatory Visit: Payer: 59 | Admitting: Physical Medicine and Rehabilitation

## 2023-11-29 VITALS — BP 115/77 | HR 88

## 2023-11-29 DIAGNOSIS — M5416 Radiculopathy, lumbar region: Secondary | ICD-10-CM | POA: Diagnosis not present

## 2023-11-29 MED ORDER — METHYLPREDNISOLONE ACETATE 40 MG/ML IJ SUSP
40.0000 mg | Freq: Once | INTRAMUSCULAR | Status: AC
  Administered 2023-11-29: 40 mg

## 2023-11-29 NOTE — Progress Notes (Signed)
Pain Score---5 No Thinners No allergies to contrast

## 2023-11-29 NOTE — Patient Instructions (Signed)

## 2023-11-30 ENCOUNTER — Other Ambulatory Visit: Payer: Self-pay | Admitting: Sports Medicine

## 2023-12-01 ENCOUNTER — Encounter: Payer: 59 | Admitting: Physical Therapy

## 2023-12-05 ENCOUNTER — Encounter: Payer: Self-pay | Admitting: Sports Medicine

## 2023-12-05 ENCOUNTER — Encounter: Payer: 59 | Admitting: Physical Therapy

## 2023-12-06 ENCOUNTER — Telehealth: Payer: Self-pay

## 2023-12-06 ENCOUNTER — Other Ambulatory Visit: Payer: Self-pay | Admitting: Physical Medicine and Rehabilitation

## 2023-12-06 DIAGNOSIS — M5416 Radiculopathy, lumbar region: Secondary | ICD-10-CM

## 2023-12-06 NOTE — Procedures (Signed)
 Lumbosacral Transforaminal Epidural Steroid Injection - Sub-Pedicular Approach with Fluoroscopic Guidance  Patient: Michelle Alexander      Date of Birth: Jul 19, 1986 MRN: [id number] PCP: Cleatis Polka., MD      Visit Date: 11/29/2023   Universal Protocol:    Date/Time: 11/29/2023  Consent Given By: the patient  Position: PRONE  Additional Comments: Vital signs were monitored before and after the procedure. Patient was prepped and draped in the usual sterile fashion. The correct patient, procedure, and site was verified.   Injection Procedure Details:   Procedure diagnoses: Lumbar radiculopathy [M54.16]    Meds Administered:  Meds ordered this encounter  Medications   methylPREDNISolone acetate (DEPO-MEDROL) injection 40 mg    Laterality: Left  Location/Site: L5  Needle:5.0 in., 22 ga.  Short bevel or Quincke spinal needle  Needle Placement: Transforaminal  Findings:    -Comments: Excellent flow of contrast along the nerve, nerve root and into the epidural space.  Procedure Details: After squaring off the end-plates to get a true AP view, the C-arm was positioned so that an oblique view of the foramen as noted above was visualized. The target area is just inferior to the "nose of the scotty dog" or sub pedicular. The soft tissues overlying this structure were infiltrated with 2-3 ml. of 1% Lidocaine without Epinephrine.  The spinal needle was inserted toward the target using a "trajectory" view along the fluoroscope beam.  Under AP and lateral visualization, the needle was advanced so it did not puncture dura and was located close the 6 O'Clock position of the pedical in AP tracterory. Biplanar projections were used to confirm position. Aspiration was confirmed to be negative for CSF and/or blood. A 1-2 ml. volume of Isovue-250 was injected and flow of contrast was noted at each level. Radiographs were obtained for documentation purposes.   After attaining the desired  flow of contrast documented above, a 0.5 to 1.0 ml test dose of 0.25% Marcaine was injected into each respective transforaminal space.  The patient was observed for 90 seconds post injection.  After no sensory deficits were reported, and normal lower extremity motor function was noted,   the above injectate was administered so that equal amounts of the injectate were placed at each foramen (level) into the transforaminal epidural space.   Additional Comments:  The patient tolerated the procedure well Dressing: 2 x 2 sterile gauze and Band-Aid    Post-procedure details: Patient was observed during the procedure. Post-procedure instructions were reviewed.  Patient left the clinic in stable condition.

## 2023-12-06 NOTE — Telephone Encounter (Signed)
 Patient advising that her injection she got last week and her left side is much better, now having pain on the right side. 100% better relief / function ability is better Still in PT, she is noticing pain starting in right side and was just wanting to advise of this due to left side started the same. She is asking to give Dr. Alvester Morin and Aundra Millet and if she need to make appointment to call her.

## 2023-12-06 NOTE — Progress Notes (Signed)
 Michelle Alexander - 38 y.o. female MRN [id number]  Date of birth: 31-Aug-1986  Office Visit Note: Visit Date: 11/29/2023 PCP: Cleatis Polka., MD Referred by: Madelyn Brunner, DO  Subjective: Chief Complaint  Patient presents with   Lower Back - Pain   HPI:  Michelle Alexander is a 38 y.o. female who comes in today at the request of Dr. Madelyn Brunner for planned Left L5-S1 Lumbar Transforaminal epidural steroid injection with fluoroscopic guidance.  The patient has failed conservative care including home exercise, medications, time and activity modification.  This injection will be diagnostic and hopefully therapeutic.  Please see requesting physician notes for further details and justification.   ROS Otherwise per HPI.  Assessment & Plan: Visit Diagnoses:    ICD-10-CM   1. Lumbar radiculopathy  M54.16 XR C-ARM NO REPORT    Epidural Steroid injection    methylPREDNISolone acetate (DEPO-MEDROL) injection 40 mg      Plan: No additional findings.   Meds & Orders:  Meds ordered this encounter  Medications   methylPREDNISolone acetate (DEPO-MEDROL) injection 40 mg    Orders Placed This Encounter  Procedures   XR C-ARM NO REPORT   Epidural Steroid injection    Follow-up: Return if symptoms worsen or fail to improve, for Madelyn Brunner, MD.   Procedures: No procedures performed  Lumbosacral Transforaminal Epidural Steroid Injection - Sub-Pedicular Approach with Fluoroscopic Guidance  Patient: Michelle Alexander      Date of Birth: September 06, 1986 MRN: [id number] PCP: Cleatis Polka., MD      Visit Date: 11/29/2023   Universal Protocol:    Date/Time: 11/29/2023  Consent Given By: the patient  Position: PRONE  Additional Comments: Vital signs were monitored before and after the procedure. Patient was prepped and draped in the usual sterile fashion. The correct patient, procedure, and site was verified.   Injection Procedure Details:   Procedure diagnoses: Lumbar radiculopathy  [M54.16]    Meds Administered:  Meds ordered this encounter  Medications   methylPREDNISolone acetate (DEPO-MEDROL) injection 40 mg    Laterality: Left  Location/Site: L5  Needle:5.0 in., 22 ga.  Short bevel or Quincke spinal needle  Needle Placement: Transforaminal  Findings:    -Comments: Excellent flow of contrast along the nerve, nerve root and into the epidural space.  Procedure Details: After squaring off the end-plates to get a true AP view, the C-arm was positioned so that an oblique view of the foramen as noted above was visualized. The target area is just inferior to the "nose of the scotty dog" or sub pedicular. The soft tissues overlying this structure were infiltrated with 2-3 ml. of 1% Lidocaine without Epinephrine.  The spinal needle was inserted toward the target using a "trajectory" view along the fluoroscope beam.  Under AP and lateral visualization, the needle was advanced so it did not puncture dura and was located close the 6 O'Clock position of the pedical in AP tracterory. Biplanar projections were used to confirm position. Aspiration was confirmed to be negative for CSF and/or blood. A 1-2 ml. volume of Isovue-250 was injected and flow of contrast was noted at each level. Radiographs were obtained for documentation purposes.   After attaining the desired flow of contrast documented above, a 0.5 to 1.0 ml test dose of 0.25% Marcaine was injected into each respective transforaminal space.  The patient was observed for 90 seconds post injection.  After no sensory deficits were reported, and normal lower extremity motor function was noted,  the above injectate was administered so that equal amounts of the injectate were placed at each foramen (level) into the transforaminal epidural space.   Additional Comments:  The patient tolerated the procedure well Dressing: 2 x 2 sterile gauze and Band-Aid    Post-procedure details: Patient was observed during the  procedure. Post-procedure instructions were reviewed.  Patient left the clinic in stable condition.    Clinical History: MRI LUMBAR SPINE WITHOUT CONTRAST   TECHNIQUE: Multiplanar, multisequence MR imaging of the lumbar spine was performed. No intravenous contrast was administered.   COMPARISON:  Lumbar spine x-rays dated October 28, 2023.   FINDINGS: Segmentation:  Last well-formed disc space is labeled L5-S1.   Alignment: Straightening of the normal lumbar lordosis. No listhesis.   Vertebrae:  No fracture, evidence of discitis, or bone lesion.   Conus medullaris and cauda equina: Conus extends to the L1 level. Conus and cauda equina appear normal.   Paraspinal and other soft tissues: 1.1 cm cyst in the inferior right liver. Otherwise negative.   Disc levels:   T12-L1 to L3-L4:  Negative.   L4-L5: Mild disc bulging with superimposed small broad-based central disc protrusion. Mild spinal canal stenosis. Moderate bilateral lateral recess stenosis. No neuroforaminal stenosis.   L5-S1: Mild disc bulging with superimposed small left paracentral disc protrusion. Mild-to-moderate bilateral lateral recess stenosis. No spinal canal or neuroforaminal stenosis.   IMPRESSION: 1. Mild multilevel lumbar spondylosis as described above. Mild spinal canal stenosis and moderate bilateral lateral recess stenosis at L4-L5, which could affect either descending L5 nerve root. 2. Mild-to-moderate bilateral lateral recess stenosis at L5-S1, which could affect either descending S1 nerve root.     Electronically Signed   By: Obie Dredge M.D.   On: 11/16/2023 16:31     Objective:  VS:  HT:    WT:   BMI:     BP:115/77  HR:88bpm  TEMP: ( )  RESP:  Physical Exam Vitals and nursing note reviewed.  Constitutional:      General: She is not in acute distress.    Appearance: Normal appearance. She is not ill-appearing.  HENT:     Head: Normocephalic and atraumatic.     Right  Ear: External ear normal.     Left Ear: External ear normal.  Eyes:     Extraocular Movements: Extraocular movements intact.  Cardiovascular:     Rate and Rhythm: Normal rate.     Pulses: Normal pulses.  Pulmonary:     Effort: Pulmonary effort is normal. No respiratory distress.  Abdominal:     General: There is no distension.     Palpations: Abdomen is soft.  Musculoskeletal:        General: Tenderness present.     Cervical back: Neck supple.     Right lower leg: No edema.     Left lower leg: No edema.     Comments: Patient has good distal strength with no pain over the greater trochanters.  No clonus or focal weakness.  Skin:    Findings: No erythema, lesion or rash.  Neurological:     General: No focal deficit present.     Mental Status: She is alert and oriented to person, place, and time.     Sensory: No sensory deficit.     Motor: No weakness or abnormal muscle tone.     Coordination: Coordination normal.  Psychiatric:        Mood and Affect: Mood normal.        Behavior:  Behavior normal.      Imaging: No results found.

## 2023-12-13 ENCOUNTER — Encounter: Payer: 59 | Admitting: Physical Therapy

## 2023-12-15 ENCOUNTER — Encounter: Payer: 59 | Admitting: Physical Therapy

## 2023-12-20 ENCOUNTER — Ambulatory Visit: Payer: 59 | Admitting: Physical Therapy

## 2023-12-20 ENCOUNTER — Ambulatory Visit (INDEPENDENT_AMBULATORY_CARE_PROVIDER_SITE_OTHER): Admitting: Physician Assistant

## 2023-12-20 ENCOUNTER — Other Ambulatory Visit (INDEPENDENT_AMBULATORY_CARE_PROVIDER_SITE_OTHER)

## 2023-12-20 ENCOUNTER — Encounter: Payer: Self-pay | Admitting: Physician Assistant

## 2023-12-20 ENCOUNTER — Encounter: Payer: Self-pay | Admitting: Physical Therapy

## 2023-12-20 DIAGNOSIS — M6281 Muscle weakness (generalized): Secondary | ICD-10-CM

## 2023-12-20 DIAGNOSIS — G8929 Other chronic pain: Secondary | ICD-10-CM | POA: Diagnosis not present

## 2023-12-20 DIAGNOSIS — M79672 Pain in left foot: Secondary | ICD-10-CM

## 2023-12-20 DIAGNOSIS — M5442 Lumbago with sciatica, left side: Secondary | ICD-10-CM

## 2023-12-20 DIAGNOSIS — M7662 Achilles tendinitis, left leg: Secondary | ICD-10-CM | POA: Diagnosis not present

## 2023-12-20 MED ORDER — MELOXICAM 15 MG PO TABS: 15.0000 mg | ORAL_TABLET | Freq: Every day | ORAL | 0 refills | Status: DC

## 2023-12-20 NOTE — Therapy (Addendum)
 OUTPATIENT PHYSICAL THERAPY LOWER EXTREMITY TREATMENT   Patient Name: Michelle Alexander MRN: [id number] DOB:Feb 22, 1986, 38 y.o., female Today's Date: 12/20/2023  END OF SESSION:  PT End of Session - 12/20/23 1308     Visit Number 4    Number of Visits 10    Date for PT Re-Evaluation 12/20/23    Authorization Type [employer] Other    Authorization Time Period 11/08/23 to 12/20/23    PT Start Time 1309    PT Stop Time 1347    PT Time Calculation (min) 38 min    Activity Tolerance Patient tolerated treatment well    Behavior During Therapy [facility] for tasks assessed/performed             History reviewed. No pertinent past medical history. History reviewed. No pertinent surgical history. There are no active problems to display for this patient.   PCP: Loreli Fallow MD   REFERRING PROVIDER: Leonce Katz, DO  REFERRING DIAG: Diagnosis M54.42,G89.29 (ICD-10-CM) - Chronic left-sided low back pain with left-sided sciatica  THERAPY DIAG:  Chronic left-sided low back pain with left-sided sciatica  Muscle weakness (generalized)  Rationale for Evaluation and Treatment: Rehabilitation  ONSET DATE: February 2024  SUBJECTIVE:   SUBJECTIVE STATEMENT: Pt 10 min late to appt today. Did have injection on L. States very good relief from injection. She has also been able to do some pilates and has had some dry needling at another office.    Started last year, normally it hurts from overuse but can't remember an exact moment that started the pain. I play tennis and rotate to the left a lot. When it started we were travelling for my daughter's volleyball tournament and that part of my back really locked up. A provider gave me meloxicam  that helped somewhat but never really got formally looked at. Over time this progressed to sciatica going down that leg. Feels like QL and down into glutes. Had a big sciatica flare in September that took a month to go away, time/meloxicam /stopping tennis helped.  Feel like its a mix of fight or flight and tight muscles. Had an MRI still waiting on results.   PERTINENT HISTORY:  See above   PAIN:  Are you having pain? No not pain right now- not shooting or stabbing just a dull ache and its maybe a 3/10- not comfortable but not debilitating. L lumbar, tennis makes it worse/meloxicam  and DN help   PRECAUTIONS: None  RED FLAGS: None   WEIGHT BEARING RESTRICTIONS: No  FALLS:  Has patient fallen in last 6 months? 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 Within 2 weeks   OBJECTIVE:  Note: Objective measures were completed at Evaluation unless otherwise noted.  DIAGNOSTIC FINDINGS:    Narrative & Impression CLINICAL DATA:  Left hip pain.   EXAM: DG HIP (WITH OR WITHOUT PELVIS) 2-3V LEFT   COMPARISON:  None Available.   FINDINGS: There is no evidence of hip fracture or dislocation. Very minimal osteophytosis noted in the superolateral acetabulum.   IMPRESSION: Very minimal degenerative joint changes of left hip.     Electronically Signed   By: Craig Farr M.D.   On: 10/28/2023 14:40   CLINICAL  DATA:  Low back pain since Fall of 2024.   EXAM: LUMBAR SPINE - 2-3 VIEW   COMPARISON:  None Available.   FINDINGS: There is no evidence of lumbar spine fracture. Curvature of spine. Minimal narrow intervertebral spaces in the lower lumbar spine.   IMPRESSION: Minimal degenerative joint changes of lower lumbar spine.   PATIENT SURVEYS:  Modified Oswestry 15/50   COGNITION: Overall cognitive status: Within functional limits for tasks assessed     SENSATION: Reports sciatica L LE      MUSCLE LENGTH:  HS R moderate limitation, L mild limitation Piriformis WNL B  Quads moderate limitation B R >L  POSTURE: rounded shoulders, forward head, decreased lumbar lordosis, and increased thoracic kyphosis  PALPATION: No areas TTP, but more mm tension noted L paraspinals   LOWER EXTREMITY ROM:  Lumbar flexion moderate limitation, RFIS some increase in pain  Lumbar extension mild limitation, compensates with hips- REIS no changes   Lumbar lateral flexion WNL   LOWER EXTREMITY MMT:  MMT Right eval Left eval  Hip flexion 4- 3-  Hip extension 4+ 4+  Hip abduction 4+ 4+  Hip adduction    Hip internal rotation    Hip external rotation    Knee flexion 3+ 2+  Knee extension 5 5  Ankle dorsiflexion 5 5  Ankle plantarflexion    Ankle inversion    Ankle eversion     (Blank rows = not tested)  LOWER EXTREMITY SPECIAL TESTS:   SLR test (-) Sciatic tension (-)                                                                                                                                 TREATMENT DATE:   Therapeutic Exercise:  Review and education on prone press ups, side glides, core strength, neutral spine, squat and flexion motions, and need for continued extension.  Neuromuscular Re-education:  Manual Therapy:  Therapeutic Activity:  Self Care: Education and discussion on current presentation, need for continued exercise and best things to do for her back, as well as activities to avoid, and slow progression of activity.    11/24/23:  Therapeutic Exercise: Aerobic: Supine: prone hip ext 2 x 10;  Seated: Standing: Stretches:  Neuromuscular Re-education: Prone press ups to elbows 2 x 10 with education on optimal form and purpose of exercise.  Standing lumbar extension: x10;  side glides to the Right 2 x 10/slow;  Bridging 2 x 10 -  cueing for optimal form and glute activation.  Manual Therapy: long leg distraction for L LE,  lumbar PA mobs.   Therapeutic Activity: squats x10 no weight,  x 10 (15lb ) with education on form; education on bend/lift/squat for home and IADLS. Reviewed back position with other lifting, row, overhead press,  Self Care:   PATIENT EDUCATION:  Education details: updated and reviewed HEP Person educated: Patient Education method: Explanation, Verbal cues, and Handouts Education comprehension: verbalized understanding,  returned demonstration, and needs further education  HOME EXERCISE PROGRAM: Access Code: [email or web address]  Exercises - Supine Single Knee to Chest Stretch  - 2 x daily - 7 x weekly - 1 sets - 5 reps - Supine Lower Trunk Rotation  - 2 x daily - 7 x weekly - 1 sets - 10 reps - Child's Pose Stretch  - 2 x daily - 7 x weekly - 1 sets - 3 reps  ASSESSMENT:  CLINICAL IMPRESSION: Pt returns to clinic today with much improved pain following injection on L. She reports being pain free at this time. Feels her low back a bit at times.Focus today for education on slow progression of activity, and continuing to do activity and ROM that feels comfortable and non painful, as well as prone exercises. Pt with good understanding. She has been able to attend pilates, and will continue to progress slow. She has met goals at this time, is independent with HEP, and will return only if she has questions or issues.  She does have question about her L achilles. States increased pain in last few weeks. Reviewed heel raise, pt with increased pain and squeaky noise and sensation of tendon. Recommended she f/u with ortho or sports med if not improved in another week. Pt states understanding.   Eval: Patient is a 38 y.o. F who was seen today for physical therapy evaluation and treatment for Diagnosis M54.42,G89.29 (ICD-10-CM) - Chronic left-sided low back pain with left-sided sciatica. Exam with objectives as above, based on findings I do think this is most likely a chronic mm strain of lumbar paraspinals. She has had some dry  needling and acupuncture from private providers with limited relief, responded well to interventions today. Will benefit from skilled PT services to address objective findings and assist in pain free return to intense tennis routine.   OBJECTIVE IMPAIRMENTS: decreased ROM, decreased strength, hypomobility, increased fascial restrictions, increased muscle spasms, impaired flexibility, impaired sensation, improper body mechanics, postural dysfunction, and pain.   ACTIVITY LIMITATIONS: carrying, lifting, bending, sitting, bathing, toileting, dressing, and locomotion level  PARTICIPATION LIMITATIONS: meal prep, cleaning, laundry, driving, shopping, and community activity  PERSONAL FACTORS: Behavior pattern, Education, Past/current experiences, and Time since onset of injury/illness/exacerbation are also affecting patient's functional outcome.   REHAB POTENTIAL: Excellent  CLINICAL DECISION MAKING: Stable/uncomplicated  EVALUATION COMPLEXITY: Low   GOALS: Goals reviewed with patient? No  SHORT TERM GOALS: Target date: 11/29/2023   Will be compliant with appropriate progressive HEP  Baseline: Goal status: MET  2.  Will demonstrate high quality biomechanics for floor to waist lifting and for turning with a load  Baseline:  Goal status: MET  3.  Will demonstrate improved awareness of posture in all functional situations with use of ergonomic aides as desired  Baseline: Goal status: MET    LONG TERM GOALS: Target date: 12/20/2023    MMT to be 5/5 all tested groups  Baseline:  Goal status: INITIAL  2.  Lumbar ROM and LE flexibility to have normalized  Baseline:  Goal status: MET  3.  Pain to be no more than 2/10 at worst with all functional tasks  Baseline:  Goal status: MET  4.  Will be able to drive/sit without limitation and no increase from resting pain levels  Baseline:  Goal status: MET  5.  Will have returned to tennis as desired with pain no more than 2/10 at worst   Baseline:  Goal status: MET  6.  Oswestry to have improved by  at least 10% in order to show improved QOL/subjective outcomes  Baseline:  Goal status: MET   PLAN:  PT FREQUENCY: 1-2x/week  PT DURATION: 6 weeks  PLANNED INTERVENTIONS: 97164- PT Re-evaluation, 97110-Therapeutic exercises, 97530- Therapeutic activity, 97535- Self Care, 02859- Manual therapy, [id number]- Aquatic Therapy, 97014- Electrical stimulation (unattended), [id number]- Ultrasound, 02987- Traction (mechanical), [id number]- Ionotophoresis 4mg /ml Dexamethasone, Taping, Dry Needling, Spinal mobilization, Cryotherapy, and Moist heat  PLAN FOR NEXT SESSION:   Tinnie Don, PT, DPT 1:08 PM  12/20/23   PHYSICAL THERAPY DISCHARGE SUMMARY  Visits from Start of Care: 4   Plan: Patient agrees to discharge.  Patient goals were not met. Patient is being discharged due to meeting the stated rehab goals.     Tinnie Don, PT, DPT 2:25 PM  05/28/24

## 2023-12-20 NOTE — Progress Notes (Signed)
 Office Visit Note   Patient: Michelle Alexander           Date of Birth: [birth year]           MRN: [id number] Visit Date: 12/20/2023              Requested by: Cleatis Polka., MD [address] [place],  [place] [address] PCP: Cleatis Polka., MD   Assessment & Plan: Visit Diagnoses: Achilles tendinitis  Plan: Patient is a pleasant 38 year old woman who is extremely active.  She is currently sees Dr. Shon Baton for some back issues she has been having and has been doing physical therapy.  Couple weeks ago she was at a volleyball tournament for her daughter and did quite a bit of walking with what she admits not very supportive shoes.  Since then she has had pain in the back of her Achilles on the left side.  She feels better with an open back shoe.  Hurts when she tries to do toe raises findings consistent with an Achilles tendinitis.  I do not want to put her in a boot because she is having some back issues and this may make that worse.  I talked her about trying to wear a supportive shoe with some arch support and it.  Also suggested going on to meloxicam which she is taken in the past have called her some more in today topical CBD oil or Voltaren gel.  I have also given her Alfredson's protocol I think she should be able to do this on her own if she does not improve will contact me if became chronic could consider shockwave therapy with Dr. Shon Baton  Follow-Up Instructions: No follow-ups on file.   Orders:  Orders Placed This Encounter  Procedures   XR Foot 2 Views Left   Meds ordered this encounter  Medications   meloxicam (MOBIC) 15 MG tablet    Sig: Take 1 tablet (15 mg total) by mouth daily.    Dispense:  30 tablet    Refill:  0      Procedures: No procedures performed   Clinical Data: No additional findings.   Subjective: Chief Complaint  Patient presents with   Left Heel - Pain    HPI Patient is a pleasant 38 year old woman who comes in today with a chief  complaint of left heel pain.  She said she did a lot of walking at a volleyball function a couple weeks ago.  She wore flat sneakers.  She goes to therapy for her back and was told by PT to have her heel looked at due to some grinding noise it is hurt when she is up on her toes can feel it when walking can hear a sound when pointing toe down stepping up bothers her heel  Review of Systems  All other systems reviewed and are negative.    Objective: Vital Signs: There were no vitals taken for this visit.  Physical Exam Constitutional:      Appearance: Normal appearance.  Pulmonary:     Effort: Pulmonary effort is normal.  Skin:    General: Skin is warm and dry.  Neurological:     General: No focal deficit present.     Mental Status: She is alert and oriented to person, place, and time.  Psychiatric:        Mood and Affect: Mood normal.        Behavior: Behavior normal.     Ortho Exam Examination  her Achilles intact she has no swelling no tenderness in any part of her foot she does have pain with resisted plantarflexion.  Pain is in the Achilles itself Achilles is intact and she has good strength pulses are intact no evidence of any infection Specialty Comments:  MRI LUMBAR SPINE WITHOUT CONTRAST   TECHNIQUE: Multiplanar, multisequence MR imaging of the lumbar spine was performed. No intravenous contrast was administered.   COMPARISON:  Lumbar spine x-rays dated October 28, 2023.   FINDINGS: Segmentation:  Last well-formed disc space is labeled L5-S1.   Alignment: Straightening of the normal lumbar lordosis. No listhesis.   Vertebrae:  No fracture, evidence of discitis, or bone lesion.   Conus medullaris and cauda equina: Conus extends to the L1 level. Conus and cauda equina appear normal.   Paraspinal and other soft tissues: 1.1 cm cyst in the inferior right liver. Otherwise negative.   Disc levels:   T12-L1 to L3-L4:  Negative.   L4-L5: Mild disc bulging with  superimposed small broad-based central disc protrusion. Mild spinal canal stenosis. Moderate bilateral lateral recess stenosis. No neuroforaminal stenosis.   L5-S1: Mild disc bulging with superimposed small left paracentral disc protrusion. Mild-to-moderate bilateral lateral recess stenosis. No spinal canal or neuroforaminal stenosis.   IMPRESSION: 1. Mild multilevel lumbar spondylosis as described above. Mild spinal canal stenosis and moderate bilateral lateral recess stenosis at L4-L5, which could affect either descending L5 nerve root. 2. Mild-to-moderate bilateral lateral recess stenosis at L5-S1, which could affect either descending S1 nerve root.     Electronically Signed   By: Obie Dredge M.D.   On: 11/16/2023 16:31  Imaging: XR Foot 2 Views Left Result Date: 12/20/2023 2 view radiographs of the left foot no evidence of any fracture well-maintained alignment no degenerative changes    PMFS History: Patient Active Problem List   Diagnosis Date Noted   Achilles tendinitis, left leg 12/20/2023   History reviewed. No pertinent past medical history.  History reviewed. No pertinent family history.  History reviewed. No pertinent surgical history. Social History   Occupational History   Not on file  Tobacco Use   Smoking status: Never   Smokeless tobacco: Never  Substance and Sexual Activity   Alcohol use: Yes   Drug use: No   Sexual activity: Not on file

## 2023-12-23 ENCOUNTER — Encounter: Payer: 59 | Admitting: Physical Therapy

## 2023-12-29 ENCOUNTER — Encounter: Payer: 59 | Admitting: Physical Therapy

## 2024-01-03 ENCOUNTER — Encounter: Payer: 59 | Admitting: Physical Therapy

## 2024-01-05 ENCOUNTER — Encounter: Payer: 59 | Admitting: Physical Therapy

## 2024-01-17 ENCOUNTER — Other Ambulatory Visit: Payer: Self-pay | Admitting: Physician Assistant

## 2024-01-25 ENCOUNTER — Other Ambulatory Visit: Payer: Self-pay | Admitting: Orthopaedic Surgery

## 2024-01-30 ENCOUNTER — Ambulatory Visit: Admitting: Sports Medicine

## 2024-03-23 ENCOUNTER — Encounter: Admitting: Sports Medicine

## 2024-03-30 ENCOUNTER — Encounter: Admitting: Sports Medicine

## 2024-04-05 ENCOUNTER — Other Ambulatory Visit: Payer: Self-pay | Admitting: Orthopaedic Surgery

## 2024-04-05 MED ORDER — MELOXICAM 15 MG PO TABS: 15.0000 mg | ORAL_TABLET | Freq: Every day | ORAL | 0 refills | Status: AC

## 2024-04-16 ENCOUNTER — Encounter: Admitting: Sports Medicine

## 2024-08-13 ENCOUNTER — Encounter: Payer: Self-pay | Admitting: Radiology

## 2024-08-25 ENCOUNTER — Other Ambulatory Visit: Payer: Self-pay | Admitting: Orthopedic Surgery

## 2024-09-16 ENCOUNTER — Other Ambulatory Visit: Payer: Self-pay | Admitting: Orthopaedic Surgery

## 2024-09-17 NOTE — Telephone Encounter (Signed)
 We have not seen in nearly 1.5 years

## 2024-11-01 ENCOUNTER — Encounter: Payer: Self-pay | Admitting: Sports Medicine

## 2024-11-01 ENCOUNTER — Ambulatory Visit: Admitting: Sports Medicine

## 2024-11-01 ENCOUNTER — Other Ambulatory Visit: Payer: Self-pay

## 2024-11-01 DIAGNOSIS — M48061 Spinal stenosis, lumbar region without neurogenic claudication: Secondary | ICD-10-CM | POA: Diagnosis not present

## 2024-11-01 DIAGNOSIS — M533 Sacrococcygeal disorders, not elsewhere classified: Secondary | ICD-10-CM | POA: Diagnosis not present

## 2024-11-01 DIAGNOSIS — M51362 Other intervertebral disc degeneration, lumbar region with discogenic back pain and lower extremity pain: Secondary | ICD-10-CM

## 2024-11-01 DIAGNOSIS — G8929 Other chronic pain: Secondary | ICD-10-CM | POA: Diagnosis not present

## 2024-11-01 MED ORDER — METHYLPREDNISOLONE 4 MG PO TBPK: ORAL_TABLET | ORAL | 0 refills | Status: AC

## 2024-11-01 NOTE — Progress Notes (Signed)
 Patient says that she is having another flare up of pain in her left glute and down the left leg. She says that it began again about 2 weeks ago, and has been much worse in the last few days. She denies having any new injury around that time, although says that some of her management had been different over the holidays. She has been taking Meloxicam , took pain medicine last night, and has gone to [facility] for dry needling. She says that the pain in her leg is down the back of her leg and stops about halfway down the calf. She denies any numbness or tingling. She does feel a pulling at the SI joint, and says that it feels like a shearing at the SI joint.

## 2024-11-01 NOTE — Progress Notes (Signed)
 "  Michelle Alexander - 39 y.o. female MRN [id number]  Date of birth: 1985-11-07  Office Visit Note: Visit Date: 11/01/2024 PCP: Loreli Elsie JONETTA Mickey., MD Referred by: Loreli Elsie JONETTA Mickey., MD  Subjective: Chief Complaint  Patient presents with   Lower Back - Pain   HPI: Michelle Alexander is a pleasant 39 y.o. female who presents today for follow-up of low back/posterior hip/leg pain.  Discussed the use of AI scribe software for clinical note transcription with the patient, who gave verbal consent to proceed.  History of Present Illness  Michelle Alexander is a 39 year old female with chronic left-sided low back pain and sciatica who presents for evaluation of recurrent symptoms.  She has 2 weeks of recurrent burning, shooting, pulsing pain in the left low back radiating down the posterior left leg to the calf, similar to her episode last year. The pain worsens at night and interferes with sleep. She notes a pulling sensation in the left glute and intermittent low back discomfort. She denies current numbness, tingling, leg weakness, or giving way, though prior flares extended to the foot with borderline numbness.  She restarted meloxicam  15mg  daily and resumed physical therapy with dry needling, which help her stay active but her last session transiently increased pain. Home exercises including knees-to-chest, cat-cow, and hip stretches are helpful, while prone exercises currently worsen symptoms. She continues tennis nearly daily, golf weekly, and reformer Pilates but has limited these, especially twisting and end-range movements, due to pain.  *Saw [facility] back in February of last year for low back pain with radicular symptoms and multiple disc bulges.  She did undergo left L5 transforaminal injection with Dr. Eldonna on 11/29/2023 --> it took a few weeks to fully kick in but by about the third week she had essentially 100% pain relief.  Pain is aggravated by straightening the leg during flares and by some  flexion, extension, twisting, and end-range movements. She adjusts activity according to pain.   Pertinent ROS were reviewed with the patient and found to be negative unless otherwise specified above in HPI.   Assessment & Plan: Visit Diagnoses:  1. Degeneration of intervertebral disc of lumbar region with discogenic back pain and lower extremity pain   2. Stenosis of lateral recess of lumbar spine   3. Chronic left SI joint pain    A/P Summary --> acute on chronic left-sided low back pain with seemingly L5 radiculopathy.  Similar symptoms to last year with resolution after transforaminal injection.  Does have some left SI joint pain and dysfunction as well, although I believe this is secondary and compensatory from her low back pathology. 6-day Medrol  dose-pak now, will start with repeat left L5 transforaminal injection and see to what degree believe this provides her.  Could consider SI joint injection only if not significantly improved with above.  Okay to continue dry needling and PT exercises following injection. Assessment & Plan Chronic left-sided low back pain with sciatica Recurrent chronic left-sided low back pain with radiating burning pain down the posterior left leg, consistent with sciatica. Symptoms are primarily sensory, attributed to ongoing nerve irritation rather than acute compression. Recurrence following prior improvement with nerve root injection. - Continued meloxicam  15 mg daily. - Prescribed prednisone  dose pack x 6 days, may resume meloxicam  15mg  following this if needed - Referred to Dr. Eldonna for repeat left-sided nerve root injection if symptoms persist or worsen. - Provided anticipatory guidance to use pain as a guide for activity  modification, avoiding end-range flexion, extension, and twisting movements. - Advised to cancel injection appointment if symptoms resolve with medical therapy.  Lumbar disc bulge with L4-L5 > L5-S1 lateral recess stenosis MRI showed  mild disc bulge at L4-L5 with desiccation and slight posterior displacement at L5, correlating with nerve root symptoms. Disc bulge contributes to nerve irritation and sciatica but does not warrant surgical intervention. - Reviewed MRI findings and discussed correlation with symptoms. - Discussed preference for oral prednisone  and/or targeted injection over surgical intervention. - Advised to avoid activities that provoke symptoms, particularly forced extension and twisting, until pain subsides.  Chronic left SI joint pain Ongoing discomfort in the left SI joint region with myofascial tightness and pulling sensations. SI joint pain appears secondary to nerve irritation and associated muscular dysfunction rather than primary joint pathology. - Advised continuation of physical therapy and dry needling with Norvel, focusing on myofascial release and muscle relaxation. - Recommended scheduling additional physical therapy sessions after nerve root injection to optimize muscle recovery. - Provided education on the role of soft tissue and connective tissue in SI joint pain and the importance of modifying activities that exacerbate symptoms. - could consider SI-joint injection only if TF-inj not significantly helpful  Follow-up: No follow-ups on file.   Meds & Orders:  Meds ordered this encounter  Medications   methylPREDNISolone  (MEDROL  DOSEPAK) 4 MG TBPK tablet    Sig: Take per packet instruction. Taper dosing.    Dispense:  1 each    Refill:  0    Orders Placed This Encounter  Procedures   US  Extrem Low Left Ltd   Ambulatory referral to Physical Medicine Rehab     Procedures: N/a        Clinical History: MRI LUMBAR SPINE WITHOUT CONTRAST   TECHNIQUE: Multiplanar, multisequence MR imaging of the lumbar spine was performed. No intravenous contrast was administered.   COMPARISON:  Lumbar spine x-rays dated October 28, 2023.   FINDINGS: Segmentation:  Last well-formed disc space  is labeled L5-S1.   Alignment: Straightening of the normal lumbar lordosis. No listhesis.   Vertebrae:  No fracture, evidence of discitis, or bone lesion.   Conus medullaris and cauda equina: Conus extends to the L1 level. Conus and cauda equina appear normal.   Paraspinal and other soft tissues: 1.1 cm cyst in the inferior right liver. Otherwise negative.   Disc levels:   T12-L1 to L3-L4:  Negative.   L4-L5: Mild disc bulging with superimposed small broad-based central disc protrusion. Mild spinal canal stenosis. Moderate bilateral lateral recess stenosis. No neuroforaminal stenosis.   L5-S1: Mild disc bulging with superimposed small left paracentral disc protrusion. Mild-to-moderate bilateral lateral recess stenosis. No spinal canal or neuroforaminal stenosis.   IMPRESSION: 1. Mild multilevel lumbar spondylosis as described above. Mild spinal canal stenosis and moderate bilateral lateral recess stenosis at L4-L5, which could affect either descending L5 nerve root. 2. Mild-to-moderate bilateral lateral recess stenosis at L5-S1, which could affect either descending S1 nerve root.     Electronically Signed   By: Elsie ONEIDA Shoulder M.D.   On: 11/16/2023 16:31  She reports that she has never smoked. She has never used smokeless tobacco. No results for input(s): HGBA1C, LABURIC in the last 8760 hours.  Objective:    Physical Exam  Gen: Well-appearing, in no acute distress; non-toxic CV: Well-perfused. Warm.  Resp: Breathing unlabored on room air; no wheezing. Psych: Fluid speech in conversation; appropriate affect; normal thought process  *[facility]/Ortho Exam: Physical Exam MUSCULOSKELETAL: Spine with normal  range of motion. Hips non-tender with normal range of motion. Legs exhibit 5/5 strength.  - Lumbar: No specific midline spinous process tenderness.  There is full range of motion with flexion and extension.  Positive SLR on the left, negative on the right.  There is pain  palpating over the left SI joint with positive Fortin's point test, although negative FABER and Gaenslen's testing.  Imaging:  - See above MRI  Past Medical/Family/Surgical/Social History: Medications & Allergies reviewed per EMR, new medications updated. Patient Active Problem List   Diagnosis Date Noted   Achilles tendinitis, left leg 12/20/2023   History reviewed. No pertinent past medical history. History reviewed. No pertinent family history. History reviewed. No pertinent surgical history. Social History   Occupational History   Not on file  Tobacco Use   Smoking status: Never   Smokeless tobacco: Never  Substance and Sexual Activity   Alcohol use: Yes   Drug use: No   Sexual activity: Not on file   "

## 2024-11-09 ENCOUNTER — Telehealth: Payer: Self-pay | Admitting: Radiology

## 2024-11-09 NOTE — Telephone Encounter (Signed)
 Patient is returning a call to [facility] or [facility] to schedule an appt.

## 2024-11-13 ENCOUNTER — Encounter: Payer: Self-pay | Admitting: Sports Medicine

## 2024-11-13 ENCOUNTER — Ambulatory Visit: Admitting: Sports Medicine

## 2024-11-13 ENCOUNTER — Other Ambulatory Visit: Payer: Self-pay | Admitting: Physical Medicine and Rehabilitation

## 2024-11-13 DIAGNOSIS — M48061 Spinal stenosis, lumbar region without neurogenic claudication: Secondary | ICD-10-CM | POA: Diagnosis not present

## 2024-11-13 DIAGNOSIS — F411 Generalized anxiety disorder: Secondary | ICD-10-CM

## 2024-11-13 DIAGNOSIS — R29898 Other symptoms and signs involving the musculoskeletal system: Secondary | ICD-10-CM

## 2024-11-13 DIAGNOSIS — M51362 Other intervertebral disc degeneration, lumbar region with discogenic back pain and lower extremity pain: Secondary | ICD-10-CM

## 2024-11-13 MED ORDER — DIAZEPAM 5 MG PO TABS: ORAL_TABLET | ORAL | 0 refills | Status: AC

## 2024-11-13 NOTE — Progress Notes (Signed)
 Patient says that the pain in her left glute and upper leg are gone since her last visit, but she has had soreness and weakness in the left calf. She says that the soreness has gone away, but the calf feels weak, and at times it feels tight. She can move through plantarflexion and dorsiflexion while seated, but feels weak and unable to push onto her toes while standing. She is inquiring about shockwave therapy for the left calf.

## 2024-11-14 ENCOUNTER — Ambulatory Visit: Admitting: Sports Medicine

## 2024-11-28 ENCOUNTER — Encounter: Admitting: Physical Medicine and Rehabilitation

## 2024-12-31 ENCOUNTER — Ambulatory Visit: Admitting: Orthopedic Surgery
